# Patient Record
Sex: Male | Born: 1980
Health system: Southern US, Community
[De-identification: ages and names within clinical notes are randomized; demographics above are authoritative.]

## PROBLEM LIST (undated history)

## (undated) DIAGNOSIS — F988 Other specified behavioral and emotional disorders with onset usually occurring in childhood and adolescence: Secondary | ICD-10-CM

## (undated) DIAGNOSIS — J189 Pneumonia, unspecified organism: Secondary | ICD-10-CM

## (undated) DIAGNOSIS — I351 Nonrheumatic aortic (valve) insufficiency: Secondary | ICD-10-CM

## (undated) DIAGNOSIS — Z720 Tobacco use: Secondary | ICD-10-CM

## (undated) DIAGNOSIS — F419 Anxiety disorder, unspecified: Secondary | ICD-10-CM

## (undated) HISTORY — DX: Nonrheumatic aortic (valve) insufficiency: I35.1

## (undated) HISTORY — DX: Anxiety disorder, unspecified: F41.9

## (undated) HISTORY — PX: ADENOIDECTOMY: SUR15

## (undated) HISTORY — DX: Tobacco use: Z72.0

## (undated) HISTORY — PX: TONSILLECTOMY: SUR1361

## (undated) HISTORY — DX: Other specified behavioral and emotional disorders with onset usually occurring in childhood and adolescence: F98.8

---
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
## 2001-05-08 ENCOUNTER — Emergency Department (HOSPITAL_COMMUNITY): Admission: EM | Admit: 2001-05-08 | Discharge: 2001-05-08 | Payer: Self-pay | Admitting: Emergency Medicine

## 2001-05-08 ENCOUNTER — Encounter: Payer: Self-pay | Admitting: *Deleted

## 2001-06-24 ENCOUNTER — Emergency Department (HOSPITAL_COMMUNITY): Admission: EM | Admit: 2001-06-24 | Discharge: 2001-06-24 | Payer: Self-pay | Admitting: Emergency Medicine

## 2001-06-24 ENCOUNTER — Encounter: Payer: Self-pay | Admitting: Emergency Medicine

## 2005-08-04 ENCOUNTER — Emergency Department (HOSPITAL_COMMUNITY): Admission: EM | Admit: 2005-08-04 | Discharge: 2005-08-05 | Payer: Self-pay | Admitting: Emergency Medicine

## 2011-03-21 ENCOUNTER — Ambulatory Visit (HOSPITAL_BASED_OUTPATIENT_CLINIC_OR_DEPARTMENT_OTHER)
Admission: RE | Admit: 2011-03-21 | Discharge: 2011-03-21 | Disposition: A | Discharge status: Home / Self Care | Payer: BC Managed Care – PPO | Source: Ambulatory Visit | Attending: Family Medicine | Admitting: Family Medicine

## 2011-03-21 ENCOUNTER — Other Ambulatory Visit (HOSPITAL_BASED_OUTPATIENT_CLINIC_OR_DEPARTMENT_OTHER): Payer: Self-pay | Admitting: Family Medicine

## 2011-03-21 DIAGNOSIS — R109 Unspecified abdominal pain: Secondary | ICD-10-CM

## 2011-12-08 HISTORY — PX: SHOULDER SURGERY: SHX246

## 2013-07-03 ENCOUNTER — Other Ambulatory Visit: Payer: Self-pay | Admitting: Physician Assistant

## 2013-07-03 MED ORDER — MELOXICAM 15 MG PO TABS: 15.0000 mg | ORAL_TABLET | Freq: Every day | ORAL | Status: DC

## 2013-07-03 MED ORDER — MELOXICAM 15 MG PO TABS: 15.0000 mg | ORAL_TABLET | ORAL | Status: DC

## 2013-07-10 ENCOUNTER — Telehealth: Payer: Self-pay | Admitting: *Deleted

## 2013-07-10 NOTE — Telephone Encounter (Signed)
William Willis his chart's on your desk - you've seen him the last 4 x

## 2013-07-11 MED ORDER — AMPHETAMINE-DEXTROAMPHETAMINE 30 MG PO TABS: 30.0000 mg | ORAL_TABLET | Freq: Two times a day (BID) | ORAL | Status: DC

## 2013-07-23 ENCOUNTER — Encounter: Payer: Self-pay | Admitting: Internal Medicine

## 2013-07-24 ENCOUNTER — Encounter: Payer: Self-pay | Admitting: Internal Medicine

## 2013-07-24 DIAGNOSIS — F988 Other specified behavioral and emotional disorders with onset usually occurring in childhood and adolescence: Secondary | ICD-10-CM | POA: Insufficient documentation

## 2013-08-09 ENCOUNTER — Other Ambulatory Visit: Payer: Self-pay | Admitting: Emergency Medicine

## 2013-08-09 MED ORDER — CARBAMAZEPINE 200 MG PO TABS: 200.0000 mg | ORAL_TABLET | Freq: Three times a day (TID) | ORAL | Status: DC

## 2013-08-09 MED ORDER — AMPHETAMINE-DEXTROAMPHETAMINE 30 MG PO TABS: 30.0000 mg | ORAL_TABLET | Freq: Two times a day (BID) | ORAL | Status: DC

## 2013-09-12 ENCOUNTER — Other Ambulatory Visit: Payer: Self-pay | Admitting: Internal Medicine

## 2013-09-12 DIAGNOSIS — F988 Other specified behavioral and emotional disorders with onset usually occurring in childhood and adolescence: Secondary | ICD-10-CM

## 2013-09-12 MED ORDER — AMPHETAMINE-DEXTROAMPHETAMINE 30 MG PO TABS: 30.0000 mg | ORAL_TABLET | Freq: Two times a day (BID) | ORAL | Status: DC

## 2013-10-07 ENCOUNTER — Other Ambulatory Visit: Payer: Self-pay | Admitting: Physician Assistant

## 2013-10-07 DIAGNOSIS — F988 Other specified behavioral and emotional disorders with onset usually occurring in childhood and adolescence: Secondary | ICD-10-CM

## 2013-10-07 MED ORDER — AMPHETAMINE-DEXTROAMPHETAMINE 30 MG PO TABS: 30.0000 mg | ORAL_TABLET | Freq: Two times a day (BID) | ORAL | Status: DC

## 2013-10-09 ENCOUNTER — Telehealth: Payer: Self-pay | Admitting: *Deleted

## 2013-10-09 MED ORDER — BUSPIRONE HCL 10 MG PO TABS: 10.0000 mg | ORAL_TABLET | Freq: Three times a day (TID) | ORAL | Status: DC

## 2013-10-09 NOTE — Telephone Encounter (Signed)
Pt is calling says that buspar was only called in #3 & says his CPE is less than a mth away asking can we authorize enough till CPE

## 2013-10-09 NOTE — Telephone Encounter (Signed)
Spoke with Lawana ChambersAndrea Jacquot advised her per Quentin MullingAmanda Collier, PA will give her a 30 day supply, she has already picked up the 10, advised her will probably have to pay another co-pay she is ok with that, advised to keep appt for additional refills

## 2013-10-15 ENCOUNTER — Other Ambulatory Visit: Payer: Self-pay | Admitting: Emergency Medicine

## 2013-10-24 ENCOUNTER — Encounter: Payer: Self-pay | Admitting: Physician Assistant

## 2013-10-24 ENCOUNTER — Ambulatory Visit (INDEPENDENT_AMBULATORY_CARE_PROVIDER_SITE_OTHER): Payer: Self-pay | Admitting: Physician Assistant

## 2013-10-24 VITALS — BP 110/72 | HR 68 | Temp 98.6°F | Resp 16 | Ht 67.0 in | Wt 161.0 lb

## 2013-10-24 DIAGNOSIS — F988 Other specified behavioral and emotional disorders with onset usually occurring in childhood and adolescence: Secondary | ICD-10-CM

## 2013-10-24 MED ORDER — AMPHETAMINE-DEXTROAMPHETAMINE 30 MG PO TABS: 30.0000 mg | ORAL_TABLET | Freq: Two times a day (BID) | ORAL | Status: DC

## 2013-10-24 MED ORDER — PREDNISONE 20 MG PO TABS: ORAL_TABLET | ORAL | Status: DC

## 2013-10-24 MED ORDER — MELOXICAM 15 MG PO TABS: ORAL_TABLET | ORAL | Status: DC

## 2013-10-24 MED ORDER — BUSPIRONE HCL 10 MG PO TABS: 10.0000 mg | ORAL_TABLET | Freq: Three times a day (TID) | ORAL | Status: DC

## 2013-10-24 MED ORDER — CARBAMAZEPINE 200 MG PO TABS: ORAL_TABLET | ORAL | Status: DC

## 2013-10-24 NOTE — Progress Notes (Signed)
HPI 33 y.o. male with history of anxiety, ADD presents for follow up. He states he works with his hands and has intermittent hand pain for years, worse for last 2-3 months. Right handed, with numbness, pain, burning in right hand in medial nerve distribution, some decrease strength. Currently in the left hand as well.   He states that the 30mg  adderall is helping him but wears off at 5 and occasionally works longer. So far he has not asked for a prescription early and occasionally does not take it on the weekends.   Past Medical History  Diagnosis Date  . ADD (attention deficit disorder)   . Anxiety     No Known Allergies    Current Outpatient Prescriptions on File Prior to Visit  Medication Sig Dispense Refill  . amphetamine-dextroamphetamine (ADDERALL) 30 MG tablet Take 1 tablet (30 mg total) by mouth 2 (two) times daily.  60 tablet  0  . busPIRone (BUSPAR) 10 MG tablet Take 1 tablet (10 mg total) by mouth 3 (three) times daily.  90 tablet  0  . EPITOL 200 MG tablet TAKE ONE TABLET BY MOUTH THREE TIMES DAILY  90 tablet  0  . meloxicam (MOBIC) 15 MG tablet Take 1 tablet (15 mg total) by mouth See admin instructions. Take only one pill daily with food.  90 tablet  0   No current facility-administered medications on file prior to visit.    ROS: all negative expect above.   Physical: Filed Weights   10/24/13 1155  Weight: 161 lb (73.029 kg)   Filed Vitals:   10/24/13 1155  BP: 110/72  Pulse: 68  Temp: 98.6 F (37 C)  Resp: 16   General Appearance: Well nourished, in no apparent distress. Eyes: PERRLA, EOMs. Sinuses: No Frontal/maxillary tenderness ENT/Mouth: Ext aud canals clear, normal light reflex with TMs without erythema, bulging. Post pharynx without erythema, swelling, exudate.  Respiratory: CTAB Cardio: RRR, no murmurs, rubs or gallops. Peripheral pulses brisk and equal bilaterally, without edema. No aortic or femoral bruits. Abdomen: Soft, with bowl sounds.  Nontender, no guarding, rebound. Lymphatics: Non tender without lymphadenopathy.  Musculoskeletal: Full ROM all peripheral extremities, 5/5 strength, and normal gait. + tinels and phalen's right hand worse than left.  Skin: Warm, dry without rashes, lesions, ecchymosis.  Neuro: Cranial nerves intact, reflexes equal bilaterally. Normal muscle tone, no cerebellar symptoms. Sensation intact.  Pysch: Awake and oriented X 3, normal affect, Insight and Judgment appropriate.   Assessment and Plan: Anxiety- continue Buspar and tegretol ADD- Adderall 30 TID # 90 NR- explained to him and girlfriend, if he uses 90 in a month time that he will no longer get a prescription from this practice. They understand the potential addictive nature of the medication.  Bilateral hand pain- likely CTS- brace, prednisone, ice, stretches

## 2013-10-24 NOTE — Patient Instructions (Addendum)
Carpal Tunnel Syndrome Carpal tunnel syndrome is a disorder of the nervous system in the wrist that causes pain, hand weakness, and/or loss of feeling. Carpal tunnel syndrome is caused by the compression, stretching, or irritation of the median nerve at the wrist joint. Athletes who experience carpal tunnel syndrome may notice a decrease in their performance to the condition, especially for sports that require strong hand or wrist action.  SYMPTOMS   Tingling, numbness, or burning pain in the hand or fingers.  Inability to sleep due to pain in the hand.  Sharp pains that shoot from the wrist up the arm or to the fingers, especially at night.  Morning stiffness or cramping of the hand.  Thumb weakness, resulting in difficulty holding objects or making a fist.  Shiny, dry skin on the hand.  Reduced performance in any sport requiring a strong grip. CAUSES   Median nerve damage at the wrist is caused by pressure due to swelling, inflammation, or scarred tissue.  Sources of pressure include:  Repetitive gripping or squeezing that causes inflammation of the tendon sheaths.  Scarring or shortening of the ligament that covers the median nerve.  Traumatic injury to the wrist or forearm such as fracture, sprain, or dislocation.  Prolonged hyperextension (wrist bent backward) or hyperflexion (wrist bent downward) of the wrist. RISK INCREASES WITH:  Diabetes mellitus.  Menopause or amenorrhea.  Rheumatoid arthritis.  Raynaud's disease.  Pregnancy.  Gout.  Kidney disease.  Ganglion cyst.  Repetitive hand or wrist action.  Hypothyroidism (underactive thyroid gland).  Repetitive jolting or shaking of the hands or wrist.  Prolonged forceful weight-bearing on the hands. PREVENTION  Bracing the hand and wrist straight during activities that involve repetitive grasping.  For activities that require prolonged extension of the wrist (bending towards the top of the forearm)  periodically change the position of your wrists.  Learn and use proper technique in activities that result in the wrist position in neutral to slight extension.  Avoid bending the wrist into full extension or flexion (up or down)  Keep the wrist in a straight (neutral) position. To keep the wrist in this position, wear a splint.  Avoid repetitive hand and wrist motions.  When possible avoid prolonged grasping of items (steering wheel of a car, a pen, a vacuum cleaner, or a rake).  Loosen your grip for activities that require prolonged grasping of items.  Place keyboards and writing surfaces at the correct height as to decrease strain on the wrist and hand.  Alternate work tasks to avoid prolonged wrist flexion.  Avoid pinching activities (needlework and writing) as they may irritate your carpal tunnel syndrome.  If these activities are necessary, complete them for shorter periods of time.  When writing, use a felt tip or roller ball pen and/or build up the grip on a pen to decrease the forces required for writing. PROGNOSIS  Carpal tunnel syndrome is usually curable with appropriate conservative treatment and sometimes resolves spontaneously. For some cases, surgery is necessary, especially if muscle wasting or nerve changes have developed.  RELATED COMPLICATIONS   Permanent numbness and a weak thumb or fingers in the affected hand.  Permanent paralysis of a portion of the hand and fingers. TREATMENT  Treatment initially consists of stopping activities that aggravate the symptoms as well as medication and ice to reduce inflammation. A wrist splint is often recommended for wear during activities of repetitive motion as well as at night. It is also important to learn and use proper technique   when performing activities that typically cause pain. On occasion, a corticosteroid injection may be given. If symptoms persist despite conservative treatment, surgery may be an option. Surgical  techniques free the pinched or compressed nerve. Carpal tunnel surgery is usually performed on an outpatient basis, meaning you go home the same day as surgery. These procedures provide almost complete relief of all symptoms in 95% of patients. Expect at least 2 weeks for healing after surgery. For cases that are the result of repeated jolting or shaking of the hand or wrist or prolonged hyperextension, surgery is not usually recommended, because stretching of the median nerve and not compression are usually the cause of carpal tunnel syndrome in these cases. MEDICATION   If pain medication is necessary, nonsteroidal anti-inflammatory medications, such as aspirin and ibuprofen, or other minor pain relievers, such as acetaminophen, are often recommended.  Do not take pain medication for 7 days before surgery.  Prescription pain relievers are usually only prescribed after surgery. Use only as directed and only as much as you need.  Corticosteroid injections may be given to reduce inflammation. However, they are not always recommended.  Vitamin B6 (pyridoxine) may reduce symptoms; use only if prescribed for your disorder. SEEK MEDICAL CARE IF:   Symptoms get worse or do not improve in 2 weeks despite treatment.  You also have a current or recent history of neck or shoulder injury that has resulted in pain or tingling elsewhere in your arm. Document Released: 08/08/2005 Document Revised: 12/03/2012 Document Reviewed: 11/20/2008 [facility] Patient Information 2014 [place], [place].   Wear brace at night for 3 weeks

## 2013-10-29 ENCOUNTER — Ambulatory Visit (INDEPENDENT_AMBULATORY_CARE_PROVIDER_SITE_OTHER): Payer: Self-pay | Admitting: Internal Medicine

## 2013-10-29 ENCOUNTER — Encounter: Payer: Self-pay | Admitting: Internal Medicine

## 2013-10-29 VITALS — BP 118/74 | HR 76 | Temp 99.1°F | Resp 18 | Ht 67.0 in | Wt 157.0 lb

## 2013-10-29 DIAGNOSIS — F411 Generalized anxiety disorder: Secondary | ICD-10-CM

## 2013-10-29 DIAGNOSIS — F909 Attention-deficit hyperactivity disorder, unspecified type: Secondary | ICD-10-CM

## 2013-10-29 NOTE — Progress Notes (Signed)
Patient ID: William Willis, male   DOB: [birth year], 33 y.o.   MRN: [id number]   Annual  Examination  This very nice 33 y.o.  MWM presents for evaluation.  Patient has been followed for ADDand anxiety Today's BP: 118/74 mmHg. Patient denies any cardiac symptoms as chest pain, palpitations, shortness of breath, dizziness or ankle swelling.   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 He had been out of work for an extended period while recovering from 2 surgeries on his Rt shoulder.   Finally, patient has history of Vitamin D Deficiency with last vitamin D of 37 in Dec 2013.    Medication List    amphetamine-dextroamphetamine 30 MG tablet  Commonly known as:  ADDERALL  Take 1 tablet (30 mg total) by mouth 2 (two) times daily.     busPIRone 10 MG tablet  Commonly known as:  BUSPAR  Take 1 tablet (10 mg total) by mouth 3 (three) times daily.     carbamazepine 200 MG tablet  Commonly known as:  Tegretol  TAKE ONE TABLET BY MOUTH THREE TIMES DAILY     cholecalciferol 1000 UNITS tablet  Commonly known as:  VITAMIN D  Take 1,000 Units by mouth daily.     cyanocobalamin 500 MCG tablet  Take 500 mcg by mouth daily.     meloxicam 15 MG tablet  Commonly known as:  MOBIC  Take only one pill daily with food.        No Known Allergies  Past Medical History  Diagnosis Date  . ADD (attention deficit disorder)   . Anxiety     Past Surgical History  Procedure Laterality Date  . Shoulder surgery Right 12-08-11    No family history on file.  History   Social History  . Marital Status: Married    Spouse Name: N/A    Number of Children: N/A  . Years of Education: N/A   Occupational History  . Sheet metal Fabricator   Social History Main Topics  . Smoking status: Current Every Day Smoker -- 2.00 packs/day  . Smokeless tobacco: Not on file  . Alcohol Use: Yes     Comment: rare  .  Drug Use: No  . Sexual Activity: Not on file     ROS Constitutional: Denies fever, chills, weight loss/gain, headaches, insomnia, fatigue, night sweats, and change in appetite. Eyes: Denies redness, blurred vision, diplopia, discharge, itchy, watery eyes.  ENT: Denies discharge, congestion, post nasal drip, epistaxis, sore throat, earache, hearing loss, dental pain, Tinnitus, Vertigo, Sinus pain, snoring.  Cardio: Denies chest pain, palpitations, irregular heartbeat, syncope, dyspnea, diaphoresis, orthopnea, PND, claudication, edema Respiratory: denies cough, dyspnea, DOE, pleurisy, hoarseness, laryngitis, wheezing.  Gastrointestinal: Denies dysphagia, heartburn, reflux, water brash, pain, cramps, nausea, vomiting, bloating, diarrhea, constipation, hematemesis, melena, hematochezia, jaundice, hemorrhoids Genitourinary: Denies dysuria, frequency, urgency, nocturia, hesitancy, discharge, hematuria, flank pain Musculoskeletal: Denies arthralgia, myalgia, stiffness, Jt. Swelling, pain, limp, and strain/sprain. Skin: Denies puritis, rash, hives, warts, acne, eczema, changing in skin lesion Neuro: No weakness, tremor, incoordination, spasms, paresthesia, pain Psychiatric: Denies confusion, memory loss, sensory loss Endocrine: Denies change in weight, skin, hair change, nocturia, and paresthesia, diabetic polys, visual blurring, hyper / hypo glycemic episodes.  Heme/Lymph: No excessive bleeding, bruising, or elarged lymph nodes.  BP: 118/74  Pulse: 76  Temp: 99.1 F (37.3 C)  Resp: 18    Estimated body mass index  is 24.58 kg/(m^2) as calculated from the following:   Height as of this encounter: 5\' 7"  (1.702 m).   Weight as of this encounter: 157 lb (71.215 kg).  Physical Exam General Appearance: Well nourished, in no apparent distress. Eyes: PERRLA, EOMs, conjunctiva no swelling or erythema, normal fundi and vessels. Sinuses: No frontal/maxillary tenderness ENT/Mouth: EACs patent / TMs   nl. Nares clear without erythema, swelling, mucoid exudates. Oral hygiene is good. No erythema, swelling, or exudate. Tongue normal, non-obstructing. Tonsils not swollen or erythematous. Hearing normal.  Neck: Supple, thyroid normal. No bruits, nodes or JVD. Respiratory: Respiratory effort normal.  BS equal and clear bilateral without rales, rhonci, wheezing or stridor. Cardio: Heart sounds are normal with regular rate and rhythm and no murmurs, rubs or gallops. Peripheral pulses are normal and equal bilaterally without edema. No aortic or femoral bruits. Chest: symmetric with normal excursions and percussion.  Abdomen: Flat, soft, with bowl sounds. Nontender, no guarding, rebound, hernias, masses, or organomegaly.  Lymphatics: Non tender without lymphadenopathy.  Genitourinary: No hernias.Testes nl. DRE - prostate nl for age - smooth & firm w/o nodules. Musculoskeletal: Full ROM all peripheral extremities, joint stability, 5/5 strength, and normal gait. Skin: Warm and dry without rashes, lesions, cyanosis, clubbing or  ecchymosis.  Neuro: Cranial nerves intact, reflexes equal bilaterally. Normal muscle tone, no cerebellar symptoms. Sensation intact.  Pysch: Awake and oriented X 3, normal affect, insight and judgment appropriate.   Assessment and Plan  1. ADD 2. Anxiety 3. Vitamin D Deficiency  Continue prudent diet as discussed, regular exercise, and medications as discussed.  Discussed med effects and SE's. Regular follow-up recommended in 3-6 months. As he is stable and doing well will defer lab studies til he is insured.

## 2013-10-29 NOTE — Patient Instructions (Addendum)
Attention Deficit Hyperactivity Disorder Attention deficit hyperactivity disorder (ADHD) is a problem with behavior issues based on the way the brain functions (neurobehavioral disorder). It is a common reason for behavior and academic problems in school. SYMPTOMS  There are 3 types of ADHD. The 3 types and some of the symptoms include:  Inattentive  Gets bored or distracted easily.  Loses or forgets things. Forgets to hand in homework.  Has trouble organizing or completing tasks.  Difficulty staying on task.  An inability to organize daily tasks and school work.  Leaving projects, chores, or homework unfinished.  Trouble paying attention or responding to details. Careless mistakes.  Difficulty following directions. Often seems like is not listening.  Dislikes activities that require sustained attention (like chores or homework).  Hyperactive-impulsive  Feels like it is impossible to sit still or stay in a seat. Fidgeting with hands and feet.  Trouble waiting turn.  Talking too much or out of turn. Interruptive.  Speaks or acts impulsively.  Aggressive, disruptive behavior.  Constantly busy or on the go, noisy.  Often leaves seat when they are expected to remain seated.  Often runs or climbs where it is not appropriate, or feels very restless.  Combined  Has symptoms of both of the above. Often children with ADHD feel discouraged about themselves and with school. They often perform well below their abilities in school. As children get older, the excess motor activities can calm down, but the problems with paying attention and staying organized persist. Most children do not outgrow ADHD but with good treatment can learn to cope with the symptoms. DIAGNOSIS  When ADHD is suspected, the diagnosis should be made by professionals trained in ADHD. This professional will collect information about the individual suspected of having ADHD. Information must be collected from  various settings where the person lives, works, or attends school.  Diagnosis will include:  Confirming symptoms began in childhood.  Ruling out other reasons for the child's behavior.  The health care providers will check with the child's school and check their medical records.  They will talk to teachers and parents.  Behavior rating scales for the child will be filled out by those dealing with the child on a daily basis. A diagnosis is made only after all information has been considered. TREATMENT  Treatment usually includes behavioral treatment, tutoring or extra support in school, and stimulant medicines. Because of the way a person's brain works with ADHD, these medicines decrease impulsivity and hyperactivity and increase attention. This is different than how they would work in a person who does not have ADHD. Other medicines used include antidepressants and certain blood pressure medicines. Most experts agree that treatment for ADHD should address all aspects of the person's functioning. Along with medicines, treatment should include structured classroom management at school. Parents should reward good behavior, provide constant discipline, and limit-setting. Tutoring should be available for the child as needed. ADHD is a life-long condition. If untreated, the disorder can have long-term serious effects into adolescence and adulthood. HOME CARE INSTRUCTIONS   Often with ADHD there is a lot of frustration among family members dealing with the condition. Blame and anger are also feelings that are common. In many cases, because the problem affects the family as a whole, the entire family may need help. A therapist can help the family find better ways to handle the disruptive behaviors of the person with ADHD and promote change. If the person with ADHD is young, most of the therapist's work   is with the parents. Parents will learn techniques for coping with and improving their child's  behavior. Sometimes only the child with the ADHD needs counseling. Your health care providers can help you make these decisions.  Children with ADHD may need help learning how to organize. Some helpful tips include:  Keep routines the same every day from wake-up time to bedtime. Schedule all activities, including homework and playtime. Keep the schedule in a place where the person with ADHD will often see it. Mark schedule changes as far in advance as possible.  Schedule outdoor and indoor recreation.  Have a place for everything and keep everything in its place. This includes clothing, backpacks, and school supplies.  Encourage writing down assignments and bringing home needed books. Work with your child's teachers for assistance in organizing school work.  Offer your child a well-balanced diet. Breakfast that includes a balance of whole grains, protein and, fruits or vegetables is especially important for school performance. Children should avoid drinks with caffeine including:  Soft drinks.  Coffee.  Tea.  However, some older children (adolescents) may find these drinks helpful in improving their attention. Because it can also be common for adolescents with ADHD to become addicted to caffeine, talk with your health care provider about what is a safe amount of caffeine intake for your child.  Children with ADHD need consistent rules that they can understand and follow. If rules are followed, give small rewards. Children with ADHD often receive, and expect, criticism. Look for good behavior and praise it. Set realistic goals. Give clear instructions. Look for activities that can foster success and self-esteem. Make time for pleasant activities with your child. Give lots of affection.  Parents are their children's greatest advocates. Learn as much as possible about ADHD. This helps you become a stronger and better advocate for your child. It also helps you educate your child's teachers and  instructors if they feel inadequate in these areas. Parent support groups are often helpful. A national group with local chapters is called Children and Adults with Attention Deficit Hyperactivity Disorder (CHADD). SEEK MEDICAL CARE IF:  Your child has repeated muscle twitches, cough or speech outbursts.  Your child has sleep problems.  Your child has a marked loss of appetite.  Your child develops depression.  Your child has new or worsening behavioral problems.  Your child develops dizziness.  Your child has a racing heart.  Your child has stomach pains.  Your child develops headaches. SEEK IMMEDIATE MEDICAL CARE IF:  Your child has been diagnosed with depression or anxiety and the symptoms seem to be getting worse.  Your child has been depressed and suddenly appears to have increased energy or motivation.  You are worried that your child is having a bad reaction to a medication he or she is taking for ADHD. Document Released: 07/29/2002 Document Revised: 05/29/2013 Document Reviewed: 04/15/2013 [facility] Patient Information 2014 [place], [place].   Generalized Anxiety Disorder Generalized anxiety disorder (GAD) is a mental disorder. It interferes with life functions, including relationships, work, and school. GAD is different from normal anxiety, which everyone experiences at some point in their lives in response to specific life events and activities. Normal anxiety actually helps us prepare for and get through these life events and activities. Normal anxiety goes away after the event or activity is over.  GAD causes anxiety that is not necessarily related to specific events or activities. It also causes excess anxiety in proportion to specific events or activities. The anxiety associated  with GAD is also difficult to control. GAD can vary from mild to severe. People with severe GAD can have intense waves of anxiety with physical symptoms (panic attacks).  SYMPTOMS The anxiety  and worry associated with GAD are difficult to control. This anxiety and worry are related to many life events and activities and also occur more days than not for 6 months or longer. People with GAD also have three or more of the following symptoms (one or more in children):  Restlessness.   Fatigue.  Difficulty concentrating.   Irritability.  Muscle tension.  Difficulty sleeping or unsatisfying sleep. DIAGNOSIS GAD is diagnosed through an assessment by your caregiver. Your caregiver will ask you questions aboutyour mood,physical symptoms, and events in your life. Your caregiver may ask you about your medical history and use of alcohol or drugs, including prescription medications. Your caregiver may also do a physical exam and blood tests. Certain medical conditions and the use of certain substances can cause symptoms similar to those associated with GAD. Your caregiver may refer you to a mental health specialist for further evaluation. TREATMENT The following therapies are usually used to treat GAD:   Medication Antidepressant medication usually is prescribed for long-term daily control. Antianxiety medications may be added in severe cases, especially when panic attacks occur.   Talk therapy (psychotherapy) Certain types of talk therapy can be helpful in treating GAD by providing support, education, and guidance. A form of talk therapy called cognitive behavioral therapy can teach you healthy ways to think about and react to daily life events and activities.  Stress managementtechniques These include yoga, meditation, and exercise and can be very helpful when they are practiced regularly. A mental health specialist can help determine which treatment is best for you. Some people see improvement with one therapy. However, other people require a combination of therapies. Document Released: 12/03/2012 Document Reviewed: 12/03/2012 [facility] Patient Information 2014 [place], [place].

## 2013-12-06 ENCOUNTER — Other Ambulatory Visit: Payer: Self-pay | Admitting: Physician Assistant

## 2013-12-10 ENCOUNTER — Other Ambulatory Visit: Payer: Self-pay | Admitting: Physician Assistant

## 2013-12-10 DIAGNOSIS — F988 Other specified behavioral and emotional disorders with onset usually occurring in childhood and adolescence: Secondary | ICD-10-CM

## 2013-12-10 MED ORDER — AMPHETAMINE-DEXTROAMPHETAMINE 30 MG PO TABS: 30.0000 mg | ORAL_TABLET | Freq: Two times a day (BID) | ORAL | Status: DC

## 2014-01-10 ENCOUNTER — Other Ambulatory Visit: Payer: Self-pay | Admitting: Internal Medicine

## 2014-01-10 DIAGNOSIS — F988 Other specified behavioral and emotional disorders with onset usually occurring in childhood and adolescence: Secondary | ICD-10-CM

## 2014-01-10 MED ORDER — AMPHETAMINE-DEXTROAMPHETAMINE 30 MG PO TABS: 30.0000 mg | ORAL_TABLET | Freq: Two times a day (BID) | ORAL | Status: DC

## 2014-01-20 ENCOUNTER — Other Ambulatory Visit: Payer: Self-pay | Admitting: Internal Medicine

## 2014-01-20 DIAGNOSIS — F988 Other specified behavioral and emotional disorders with onset usually occurring in childhood and adolescence: Secondary | ICD-10-CM

## 2014-01-20 MED ORDER — AMPHETAMINE-DEXTROAMPHETAMINE 30 MG PO TABS: ORAL_TABLET | ORAL | Status: DC

## 2014-01-21 ENCOUNTER — Other Ambulatory Visit: Payer: Self-pay | Admitting: Physician Assistant

## 2014-01-21 DIAGNOSIS — F988 Other specified behavioral and emotional disorders with onset usually occurring in childhood and adolescence: Secondary | ICD-10-CM

## 2014-01-21 MED ORDER — AMPHETAMINE-DEXTROAMPHETAMINE 30 MG PO TABS: ORAL_TABLET | ORAL | Status: DC

## 2014-01-21 NOTE — Progress Notes (Signed)
Will do TID, but will monitor very closely. Patient should be getting insurance in 1-2 months and will be able to try Wellbutrin at that time. Works multiple jobs and take 2 during the day and occ 1/2 at night.

## 2014-01-23 ENCOUNTER — Other Ambulatory Visit: Payer: Self-pay | Admitting: Internal Medicine

## 2014-02-11 ENCOUNTER — Other Ambulatory Visit: Payer: Self-pay | Admitting: Physician Assistant

## 2014-03-05 ENCOUNTER — Other Ambulatory Visit: Payer: Self-pay | Admitting: Physician Assistant

## 2014-03-05 DIAGNOSIS — F988 Other specified behavioral and emotional disorders with onset usually occurring in childhood and adolescence: Secondary | ICD-10-CM

## 2014-03-05 MED ORDER — AMPHETAMINE-DEXTROAMPHETAMINE 30 MG PO TABS: ORAL_TABLET | ORAL | Status: DC

## 2014-03-23 ENCOUNTER — Other Ambulatory Visit: Payer: Self-pay | Admitting: Physician Assistant

## 2014-04-21 ENCOUNTER — Other Ambulatory Visit: Payer: Self-pay | Admitting: Internal Medicine

## 2014-04-21 DIAGNOSIS — F988 Other specified behavioral and emotional disorders with onset usually occurring in childhood and adolescence: Secondary | ICD-10-CM

## 2014-04-21 MED ORDER — AMPHETAMINE-DEXTROAMPHETAMINE 30 MG PO TABS: ORAL_TABLET | ORAL | Status: DC

## 2014-05-06 ENCOUNTER — Ambulatory Visit: Payer: Self-pay | Admitting: Internal Medicine

## 2014-05-06 ENCOUNTER — Encounter: Payer: Self-pay | Admitting: Internal Medicine

## 2014-05-06 NOTE — Progress Notes (Signed)
Patient ID: William Willis, male   DOB: 09/19/1980, 33 y.o.   MRN: [id number]   R E S C H E D U L E D

## 2014-05-07 ENCOUNTER — Ambulatory Visit (INDEPENDENT_AMBULATORY_CARE_PROVIDER_SITE_OTHER): Payer: Self-pay | Admitting: Internal Medicine

## 2014-05-07 ENCOUNTER — Ambulatory Visit: Payer: Self-pay | Admitting: Internal Medicine

## 2014-05-07 ENCOUNTER — Encounter: Payer: Self-pay | Admitting: Internal Medicine

## 2014-05-07 VITALS — BP 116/80 | HR 84 | Temp 98.1°F | Resp 16 | Ht 67.0 in | Wt 159.2 lb

## 2014-05-07 DIAGNOSIS — F988 Other specified behavioral and emotional disorders with onset usually occurring in childhood and adolescence: Secondary | ICD-10-CM

## 2014-05-07 DIAGNOSIS — E559 Vitamin D deficiency, unspecified: Secondary | ICD-10-CM | POA: Insufficient documentation

## 2014-05-07 MED ORDER — AMPHETAMINE-DEXTROAMPHETAMINE 30 MG PO TABS: ORAL_TABLET | ORAL | Status: DC

## 2014-05-07 NOTE — Patient Instructions (Signed)
Generalized Anxiety Disorder Generalized anxiety disorder (GAD) is a mental disorder. It interferes with life functions, including relationships, work, and school. GAD is different from normal anxiety, which everyone experiences at some point in their lives in response to specific life events and activities. Normal anxiety actually helps us prepare for and get through these life events and activities. Normal anxiety goes away after the event or activity is over.  GAD causes anxiety that is not necessarily related to specific events or activities. It also causes excess anxiety in proportion to specific events or activities. The anxiety associated with GAD is also difficult to control. GAD can vary from mild to severe. People with severe GAD can have intense waves of anxiety with physical symptoms (panic attacks).  SYMPTOMS The anxiety and worry associated with GAD are difficult to control. This anxiety and worry are related to many life events and activities and also occur more days than not for 6 months or longer. People with GAD also have three or more of the following symptoms (one or more in children):  Restlessness.   Fatigue.  Difficulty concentrating.   Irritability.  Muscle tension.  Difficulty sleeping or unsatisfying sleep. DIAGNOSIS GAD is diagnosed through an assessment by your health care provider. Your health care provider will ask you questions aboutyour mood,physical symptoms, and events in your life. Your health care provider may ask you about your medical history and use of alcohol or drugs, including prescription medicines. Your health care provider may also do a physical exam and blood tests. Certain medical conditions and the use of certain substances can cause symptoms similar to those associated with GAD. Your health care provider may refer you to a mental health specialist for further evaluation. TREATMENT The following therapies are usually used to treat GAD:    Medication. Antidepressant medication usually is prescribed for long-term daily control. Antianxiety medicines may be added in severe cases, especially when panic attacks occur.   Talk therapy (psychotherapy). Certain types of talk therapy can be helpful in treating GAD by providing support, education, and guidance. A form of talk therapy called cognitive behavioral therapy can teach you healthy ways to think about and react to daily life events and activities.  Stress managementtechniques. These include yoga, meditation, and exercise and can be very helpful when they are practiced regularly. A mental health specialist can help determine which treatment is best for you. Some people see improvement with one therapy. However, other people require a combination of therapies. Document Released: 12/03/2012 Document Revised: 12/23/2013 Document Reviewed: 12/03/2012 ExitCare Patient Information 2015 [employer]. This information is not intended to replace advice given to you by your health care provider. Make sure you discuss any questions you have with your health care provider. Bipolar Disorder Bipolar disorder is a mental illness. The term bipolar disorder actually is used to describe a group of disorders that all share varying degrees of emotional highs and lows that can interfere with daily functioning, such as work, school, or relationships. Bipolar disorder also can lead to drug abuse, hospitalization, and suicide. The emotional highs of bipolar disorder are periods of elation or irritability and high energy. These highs can range from a mild form (hypomania) to a severe form (mania). People experiencing episodes of hypomania may appear energetic, excitable, and highly productive. People experiencing mania may behave impulsively or erratically. They often make poor decisions. They may have difficulty sleeping. The most severe episodes of mania can involve having very distorted beliefs or  perceptions about the   world and seeing or hearing things that are not real (psychotic delusions and hallucinations).  The emotional lows of bipolar disorder (depression) also can range from mild to severe. Severe episodes of bipolar depression can involve psychotic delusions and hallucinations. Sometimes people with bipolar disorder experience a state of mixed mood. Symptoms of hypomania or mania and depression are both present during this mixed-mood episode. SIGNS AND SYMPTOMS There are signs and symptoms of the episodes of hypomania and mania as well as the episodes of depression. The signs and symptoms of hypomania and mania are similar but vary in severity. They include:  Inflated self-esteem or feeling of increased self-confidence.  Decreased need for sleep.  Unusual talkativeness (rapid or pressured speech) or the feeling of a need to keep talking.  Sensation of racing thoughts or constant talking, with quick shifts between topics that may or may not be related (flight of ideas).  Decreased ability to focus or concentrate.  Increased purposeful activity, such as work, studies, or social activity, or nonproductive activity, such as pacing, squirming and fidgeting, or finger and toe tapping.  Impulsive behavior and use of poor judgment, resulting in high-risk activities, such as having unprotected sex or spending excessive amounts of money. Signs and symptoms of depression include the following:   Feelings of sadness, hopelessness, or helplessness.  Frequent or uncontrollable episodes of crying.  Lack of feeling anything or caring about anything.  Difficulty sleeping or sleeping too much.  Inability to enjoy the things you used to enjoy.   Desire to be alone all the time.   Feelings of guilt or worthlessness.  Lack of energy or motivation.   Difficulty concentrating, remembering, or making decisions.  Change in appetite or weight beyond normal  fluctuations.  Thoughts of death or the desire to harm yourself. DIAGNOSIS  Bipolar disorder is diagnosed through an assessment by your caregiver. Your caregiver will ask questions about your emotional episodes. There are two main types of bipolar disorder. People with type I bipolar disorder have manic episodes with or without depressive episodes. People with type II bipolar disorder have hypomanic episodes and major depressive episodes, which are more serious than mild depression. The type of bipolar disorder you have can make an important difference in how your illness is monitored and treated. Your caregiver may ask questions about your medical history and use of alcohol or drugs, including prescription medication. Certain medical conditions and substances also can cause emotional highs and lows that resemble bipolar disorder (secondary bipolar disorder).  TREATMENT  Bipolar disorder is a long-term illness. It is best controlled with continuous treatment rather than treatment only when symptoms occur. The following treatments can be prescribed for bipolar disorders:  Medication--Medication can be prescribed by a doctor that is an expert in treating mental disorders (psychiatrists). Medications called mood stabilizers are usually prescribed to help control the illness. Other medications are sometimes added if symptoms of mania, depression, or psychotic delusions and hallucinations occur despite the use of a mood stabilizer.  Talk therapy--Some forms of talk therapy are helpful in providing support, education, and guidance. A combination of medication and talk therapy is best for managing the disorder over time. A procedure in which electricity is applied to your brain through your scalp (electroconvulsive therapy) is used in cases of severe mania when medication and talk therapy do not work or work too slowly. Document Released: 11/14/2000 Document Revised: 12/03/2012 Document Reviewed:  09/03/2012 Exit[employer] Patient Information 2015 [employer]. This information is not intended to replace   advice given to you by your health care provider. Make sure you discuss any questions you have with your health care provider.  

## 2014-05-07 NOTE — Progress Notes (Signed)
   Subjective:    Patient ID: William Willis, male    DOB: Jul 13, 1981, 33 y.o.   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 Hiss ADD predates many years since initial Dx at age 60 and he still reports benefit of meds in helping him focus and concentrate in his daily work activities.   Medication List   amphetamine-dextroamphetamine 30 MG tablet  Commonly known as:  ADDERALL  1 tablet BID for ADD     busPIRone 10 MG tablet  Commonly known as:  BUSPAR  TAKE 1 TABLET BY MOUTH THREE TIMES DAILY     carbamazepine 200 MG tablet  Commonly known as:  TEGRETOL  TAKE 1 TABLET BY MOUTH THREE TIMES DAILY     cholecalciferol 1000 UNITS tablet  Commonly known as:  VITAMIN D  Take 1,000 Units by mouth daily.     cyanocobalamin 500 MCG tablet  Take 500 mcg by mouth daily.     meloxicam 15 MG tablet  Commonly known as:  MOBIC  Take only one pill daily with food.     No Known Allergies  Past Medical History  Diagnosis Date  . ADD (attention deficit disorder)   . Anxiety    Review of Systems 12 point systems review is negative.  Objective:   Physical Exam  BP 116/80  Pulse 84  Temp(Src) 98.1 F (36.7 C) (Temporal)  Resp 16  Ht  (1.702 m)  Wt 159 lb 3.2 oz (72.213 kg)  BMI 24.93 kg/m2  HEENT - Eac's patent. TM's Nl. EOM's full. PERRLA. NasoOroPharynx clear. Neck - supple. Nl Thyroid. Carotids 2+ & No bruits, nodes, JVD Chest - Clear equal BS w/o Rales, rhonchi, wheezes. Cor - Nl HS. RRR w/o sig MGR. PP 1(+). No edema. Abd - No palpable organomegaly, masses or tenderness. BS nl. MS- FROM w/o deformities. Muscle power, tone and bulk Nl. Gait Nl. Neuro - No obvious Cr N abnormalities. Sensory, motor and Cerebellar functions appear Nl w/o focal abnormalities. Psyche -  Mental status normal & appropriate.  No delusions, ideations or obvious mood abnormalities.  Assessment & Plan:   1. ADD (attention deficit disorder)  - amphetamine-dextroamphetamine (ADDERALL) 30 MG tablet; 1 tablet BID for ADD  Dispense: 90 tablet; Refill: 0 (predated for next refill on Sept 30) 2. Vitamin D Deficiency

## 2014-05-13 ENCOUNTER — Other Ambulatory Visit: Payer: Self-pay | Admitting: Internal Medicine

## 2014-07-08 ENCOUNTER — Other Ambulatory Visit: Payer: Self-pay | Admitting: Internal Medicine

## 2014-07-08 ENCOUNTER — Other Ambulatory Visit: Payer: Self-pay

## 2014-07-08 DIAGNOSIS — F988 Other specified behavioral and emotional disorders with onset usually occurring in childhood and adolescence: Secondary | ICD-10-CM

## 2014-07-08 MED ORDER — AMPHETAMINE-DEXTROAMPHETAMINE 30 MG PO TABS: ORAL_TABLET | ORAL | Status: DC

## 2014-07-08 MED ORDER — BUSPIRONE HCL 10 MG PO TABS: 10.0000 mg | ORAL_TABLET | Freq: Three times a day (TID) | ORAL | Status: DC

## 2014-07-14 ENCOUNTER — Other Ambulatory Visit: Payer: Self-pay | Admitting: Physician Assistant

## 2014-07-31 ENCOUNTER — Encounter: Payer: Self-pay | Admitting: Internal Medicine

## 2014-08-20 ENCOUNTER — Other Ambulatory Visit: Payer: Self-pay | Admitting: Physician Assistant

## 2014-08-20 ENCOUNTER — Other Ambulatory Visit: Payer: Self-pay

## 2014-08-20 DIAGNOSIS — F988 Other specified behavioral and emotional disorders with onset usually occurring in childhood and adolescence: Secondary | ICD-10-CM

## 2014-08-20 MED ORDER — AMPHETAMINE-DEXTROAMPHETAMINE 30 MG PO TABS: ORAL_TABLET | ORAL | Status: DC

## 2014-08-20 NOTE — Telephone Encounter (Signed)
Per patient request changed Rx from 90 lasting 45 days to 60 for 30 days to avoid confusion

## 2014-09-22 ENCOUNTER — Other Ambulatory Visit: Payer: Self-pay | Admitting: Internal Medicine

## 2014-09-22 DIAGNOSIS — F988 Other specified behavioral and emotional disorders with onset usually occurring in childhood and adolescence: Secondary | ICD-10-CM

## 2014-09-22 MED ORDER — AMPHETAMINE-DEXTROAMPHETAMINE 30 MG PO TABS: ORAL_TABLET | ORAL | Status: DC

## 2014-09-23 ENCOUNTER — Other Ambulatory Visit: Payer: Self-pay | Admitting: *Deleted

## 2014-09-23 MED ORDER — MELOXICAM 15 MG PO TABS: ORAL_TABLET | ORAL | Status: DC

## 2014-10-21 ENCOUNTER — Other Ambulatory Visit: Payer: Self-pay | Admitting: Internal Medicine

## 2014-10-21 DIAGNOSIS — F988 Other specified behavioral and emotional disorders with onset usually occurring in childhood and adolescence: Secondary | ICD-10-CM

## 2014-10-21 MED ORDER — AMPHETAMINE-DEXTROAMPHETAMINE 30 MG PO TABS: ORAL_TABLET | ORAL | Status: DC

## 2014-10-30 ENCOUNTER — Encounter: Payer: Self-pay | Admitting: Internal Medicine

## 2014-11-24 ENCOUNTER — Other Ambulatory Visit: Payer: Self-pay | Admitting: Internal Medicine

## 2014-11-24 DIAGNOSIS — F988 Other specified behavioral and emotional disorders with onset usually occurring in childhood and adolescence: Secondary | ICD-10-CM

## 2014-11-24 MED ORDER — AMPHETAMINE-DEXTROAMPHETAMINE 30 MG PO TABS: ORAL_TABLET | ORAL | Status: DC

## 2014-12-04 ENCOUNTER — Encounter: Payer: Self-pay | Admitting: Internal Medicine

## 2014-12-04 ENCOUNTER — Other Ambulatory Visit: Payer: Self-pay | Admitting: *Deleted

## 2014-12-04 ENCOUNTER — Ambulatory Visit: Payer: Self-pay | Admitting: Internal Medicine

## 2014-12-04 VITALS — BP 132/76 | HR 72 | Temp 98.2°F | Resp 16 | Ht 67.5 in | Wt 153.2 lb

## 2014-12-04 DIAGNOSIS — F988 Other specified behavioral and emotional disorders with onset usually occurring in childhood and adolescence: Secondary | ICD-10-CM

## 2014-12-04 DIAGNOSIS — F411 Generalized anxiety disorder: Secondary | ICD-10-CM

## 2014-12-04 DIAGNOSIS — E559 Vitamin D deficiency, unspecified: Secondary | ICD-10-CM

## 2014-12-04 DIAGNOSIS — Z0000 Encounter for general adult medical examination without abnormal findings: Secondary | ICD-10-CM

## 2014-12-04 MED ORDER — CARBAMAZEPINE 200 MG PO TABS: 200.0000 mg | ORAL_TABLET | Freq: Three times a day (TID) | ORAL | Status: DC

## 2014-12-04 MED ORDER — BUSPIRONE HCL 10 MG PO TABS: 10.0000 mg | ORAL_TABLET | Freq: Three times a day (TID) | ORAL | Status: DC

## 2014-12-04 MED ORDER — BUPROPION HCL ER (XL) 300 MG PO TB24: 300.0000 mg | ORAL_TABLET | ORAL | Status: DC

## 2014-12-04 MED ORDER — MELOXICAM 15 MG PO TABS: ORAL_TABLET | ORAL | Status: DC

## 2014-12-04 NOTE — Patient Instructions (Signed)

## 2014-12-06 NOTE — Progress Notes (Signed)
Patient ID: William Willis, male   DOB: 1980-09-03, 34 y.o.   MRN: [id number]   Annual Preventative Comprehensive Examination   This very nice 34 y.o. MWM presents for complete physical.  Patient has no major health issues. He does have hx/o ADD and is doing well on prn doses of Adderall that he reserves for work days in improving his focusing & concentration. He also has hx/o anxiety and dysthymia and is stable and doing well on Buspar & Tegretol without wide swings in mood.   Finally, patient has history of Vitamin D Deficiency and last vitamin D was 37 in 3013.   Medication Sig  . ADDERALL 30 MG tablet Take 1/2 to 1 tablet 2 x day only if needed for focusing & concentration  . VITAMIN D 1000 UNITS Take 1,000 Units by mouth daily.  . cyanocobalamin 500 MCG tablet Take 500 mcg by mouth daily.  . busPIRone (BUSPAR) 10 MG tablet Take 1 tablet (10 mg total) by mouth 3 (three) times daily.  . carbamazepine (TEGRETOL) 200 MG  TAKE 1 TABLET BY MOUTH THREE TIMES DAILY  . meloxicam (MOBIC) 15 MG tablet Take only one pill daily with food.    No Known Allergies  Past Medical History  Diagnosis Date  . ADD (attention deficit disorder)   . Anxiety    Past Surgical History  Procedure Laterality Date  . Shoulder surgery Right 12-08-11   History   Social History  . Marital Status: Married    Spouse Name: N/A  . Number of Children: N/A  . Years of Education: N/A   Occupational History  . [employer] tech   Social History Main Topics  . Smoking status: Current Every Day Smoker -- 2.00 packs/day  . Smokeless tobacco: No  . Alcohol Use: Yes     Comment: rare  . Drug Use: No  . Sexual Activity: Active    ROS Constitutional: Denies fever, chills, weight loss/gain, headaches, insomnia, fatigue, night sweats, and change in appetite. Eyes: Denies redness, blurred vision, diplopia, discharge, itchy, watery eyes.  ENT: Denies discharge, congestion, post nasal drip, epistaxis, sore throat,  earache, hearing loss, dental pain, Tinnitus, Vertigo, Sinus pain, snoring.  Cardio: Denies chest pain, palpitations, irregular heartbeat, syncope, dyspnea, diaphoresis, orthopnea, PND, claudication, edema Respiratory: denies cough, dyspnea, DOE, pleurisy, hoarseness, laryngitis, wheezing.  Gastrointestinal: Denies dysphagia, heartburn, reflux, water brash, pain, cramps, nausea, vomiting, bloating, diarrhea, constipation, hematemesis, melena, hematochezia, jaundice, hemorrhoids Genitourinary: Denies dysuria, frequency, urgency, nocturia, hesitancy, discharge, hematuria, flank pain Musculoskeletal: Denies arthralgia, myalgia, stiffness, Jt. Swelling, pain, limp, and strain/sprain. Skin: Denies puritis, rash, hives, warts, acne, eczema, changing in skin lesion Neuro: Weakness, tremor, incoordination, spasms, paresthesia, pain Psychiatric: Denies confusion, memory loss, sensory loss. Endocrine: Denies change in weight, skin, hair change, nocturia, and paresthesia, diabetic polys, visual blurring, hyper /hypo glycemic episodes.  Heme/Lymph: No excessive bleeding, bruising, enlarged lymph nodes.   Physical Exam  BP 132/76 mmHg  Pulse 72  Temp(Src) 98.2 F (36.8 C)  Resp 16  Ht 5' 7.5" (1.715 m)  Wt 153 lb 3.2 oz (69.491 kg)  BMI 23.63 kg/m2  General Appearance: Well nourished and in no apparent distress. Eyes: PERRLA, EOMs, conjunctiva no swelling or erythema, normal fundi and vessels. Sinuses: No frontal/maxillary tenderness ENT/Mouth: EACs patent / TMs  nl. Nares clear without erythema, swelling, mucoid exudates. Oral hygiene is good. No erythema, swelling, or exudate. Tongue normal, non-obstructing. Tonsils not swollen or erythematous. Hearing normal.  Neck: Supple, thyroid normal. No bruits, nodes  or JVD. Respiratory: Respiratory effort normal.  BS equal and clear bilateral without rales, rhonci, wheezing or stridor. Cardio: Heart sounds are normal with regular rate and rhythm and no  murmurs, rubs or gallops. Peripheral pulses are normal and equal bilaterally without edema. No aortic or femoral bruits. Chest: symmetric with normal excursions and percussion.  Abdomen: Flat, soft, with bowl sounds. Nontender, no guarding, rebound, hernias, masses, or organomegaly.  Lymphatics: Non tender without lymphadenopathy.  Genitourinary: No hernia. Genitalia Nl.  Musculoskeletal: Full ROM all peripheral extremities, joint stability, 5/5 strength, and normal gait. Skin: Warm and dry without rashes, lesions, cyanosis, clubbing or  ecchymosis.  Neuro: Cranial nerves intact, reflexes equal bilaterally. Normal muscle tone, no cerebellar symptoms. Sensation intact.  Pysch: Awake and oriented X 3, normal affect, Insight and Judgment appropriate.   Assessment and Plan  1. ADD (attention deficit disorder)   2. Anxiety state   Continue prudent diet as discussed, weight control, regular exercise, and medications. Routine screening labs and tests as requested with regular follow-up as recommended.

## 2014-12-20 ENCOUNTER — Other Ambulatory Visit: Payer: Self-pay | Admitting: Internal Medicine

## 2014-12-24 ENCOUNTER — Other Ambulatory Visit: Payer: Self-pay | Admitting: Physician Assistant

## 2014-12-24 DIAGNOSIS — F988 Other specified behavioral and emotional disorders with onset usually occurring in childhood and adolescence: Secondary | ICD-10-CM

## 2014-12-24 MED ORDER — AMPHETAMINE-DEXTROAMPHETAMINE 30 MG PO TABS: ORAL_TABLET | ORAL | Status: DC

## 2015-01-20 ENCOUNTER — Encounter: Payer: Self-pay | Admitting: Physician Assistant

## 2015-01-20 ENCOUNTER — Other Ambulatory Visit: Payer: Self-pay | Admitting: Physician Assistant

## 2015-01-20 DIAGNOSIS — F988 Other specified behavioral and emotional disorders with onset usually occurring in childhood and adolescence: Secondary | ICD-10-CM

## 2015-01-20 MED ORDER — AMPHETAMINE-DEXTROAMPHETAMINE 30 MG PO TABS: ORAL_TABLET | ORAL | Status: DC

## 2015-02-08 ENCOUNTER — Encounter (HOSPITAL_COMMUNITY): Payer: Self-pay | Admitting: Nurse Practitioner

## 2015-02-08 ENCOUNTER — Emergency Department (HOSPITAL_COMMUNITY): Payer: Self-pay

## 2015-02-08 ENCOUNTER — Emergency Department (HOSPITAL_COMMUNITY): Payer: No Typology Code available for payment source

## 2015-02-08 ENCOUNTER — Emergency Department (HOSPITAL_COMMUNITY)
Admission: EM | Admit: 2015-02-08 | Discharge: 2015-02-08 | Disposition: A | Discharge status: Home / Self Care | Payer: Self-pay | Attending: Emergency Medicine | Admitting: Emergency Medicine

## 2015-02-08 DIAGNOSIS — S29001A Unspecified injury of muscle and tendon of front wall of thorax, initial encounter: Secondary | ICD-10-CM | POA: Insufficient documentation

## 2015-02-08 DIAGNOSIS — F419 Anxiety disorder, unspecified: Secondary | ICD-10-CM | POA: Insufficient documentation

## 2015-02-08 DIAGNOSIS — Y998 Other external cause status: Secondary | ICD-10-CM | POA: Insufficient documentation

## 2015-02-08 DIAGNOSIS — S99911A Unspecified injury of right ankle, initial encounter: Secondary | ICD-10-CM | POA: Insufficient documentation

## 2015-02-08 DIAGNOSIS — S3992XA Unspecified injury of lower back, initial encounter: Secondary | ICD-10-CM | POA: Insufficient documentation

## 2015-02-08 DIAGNOSIS — Z79899 Other long term (current) drug therapy: Secondary | ICD-10-CM | POA: Insufficient documentation

## 2015-02-08 DIAGNOSIS — R52 Pain, unspecified: Secondary | ICD-10-CM

## 2015-02-08 DIAGNOSIS — Z23 Encounter for immunization: Secondary | ICD-10-CM | POA: Insufficient documentation

## 2015-02-08 DIAGNOSIS — S023XXA Fracture of orbital floor, initial encounter for closed fracture: Secondary | ICD-10-CM | POA: Insufficient documentation

## 2015-02-08 DIAGNOSIS — Y9289 Other specified places as the place of occurrence of the external cause: Secondary | ICD-10-CM | POA: Insufficient documentation

## 2015-02-08 DIAGNOSIS — R937 Abnormal findings on diagnostic imaging of other parts of musculoskeletal system: Secondary | ICD-10-CM

## 2015-02-08 DIAGNOSIS — F909 Attention-deficit hyperactivity disorder, unspecified type: Secondary | ICD-10-CM | POA: Insufficient documentation

## 2015-02-08 DIAGNOSIS — S60511A Abrasion of right hand, initial encounter: Secondary | ICD-10-CM | POA: Insufficient documentation

## 2015-02-08 DIAGNOSIS — S6991XA Unspecified injury of right wrist, hand and finger(s), initial encounter: Secondary | ICD-10-CM | POA: Insufficient documentation

## 2015-02-08 DIAGNOSIS — Z720 Tobacco use: Secondary | ICD-10-CM | POA: Insufficient documentation

## 2015-02-08 DIAGNOSIS — Y9389 Activity, other specified: Secondary | ICD-10-CM | POA: Insufficient documentation

## 2015-02-08 DIAGNOSIS — S0230XA Fracture of orbital floor, unspecified side, initial encounter for closed fracture: Secondary | ICD-10-CM

## 2015-02-08 DIAGNOSIS — Z791 Long term (current) use of non-steroidal anti-inflammatories (NSAID): Secondary | ICD-10-CM | POA: Insufficient documentation

## 2015-02-08 DIAGNOSIS — T148XXA Other injury of unspecified body region, initial encounter: Secondary | ICD-10-CM

## 2015-02-08 MED ORDER — IBUPROFEN 800 MG PO TABS
800.0000 mg | ORAL_TABLET | Freq: Once | ORAL | Status: AC
  Administered 2015-02-08: 800 mg via ORAL
  Filled 2015-02-08: qty 1

## 2015-02-08 MED ORDER — TETANUS-DIPHTH-ACELL PERTUSSIS 5-2.5-18.5 LF-MCG/0.5 IM SUSP
0.5000 mL | Freq: Once | INTRAMUSCULAR | Status: AC
  Administered 2015-02-08: 0.5 mL via INTRAMUSCULAR
  Filled 2015-02-08: qty 0.5

## 2015-02-08 MED ORDER — BUSPIRONE HCL 10 MG PO TABS
10.0000 mg | ORAL_TABLET | Freq: Once | ORAL | Status: AC
  Administered 2015-02-08: 10 mg via ORAL
  Filled 2015-02-08: qty 1

## 2015-02-08 MED ORDER — BACITRACIN 500 UNIT/GM EX OINT
1.0000 "application " | TOPICAL_OINTMENT | Freq: Two times a day (BID) | CUTANEOUS | Status: DC
  Filled 2015-02-08 (×2): qty 0.9

## 2015-02-08 MED ORDER — AMPHETAMINE-DEXTROAMPHETAMINE 10 MG PO TABS: 30.0000 mg | ORAL_TABLET | Freq: Every day | ORAL | Status: DC

## 2015-02-08 MED ORDER — CARBAMAZEPINE 200 MG PO TABS
200.0000 mg | ORAL_TABLET | Freq: Once | ORAL | Status: AC
  Administered 2015-02-08: 200 mg via ORAL
  Filled 2015-02-08: qty 1
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
## 2015-02-08 NOTE — ED Notes (Signed)
PA at bedside.

## 2015-02-08 NOTE — ED Notes (Signed)
Pt placed in gown and in bed. Pt monitored by pulse ox, bp cuff, and 5-lead. 
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
## 2015-02-08 NOTE — ED Notes (Signed)
Pt transported to CT and x-ray  

## 2015-02-08 NOTE — ED Notes (Signed)
c-collar applied  

## 2015-02-08 NOTE — ED Notes (Signed)
Pt transported to MRI 

## 2015-02-08 NOTE — Discharge Instructions (Signed)
DO NOT BLOW YOUR NOSE

## 2015-02-24 ENCOUNTER — Other Ambulatory Visit: Payer: Self-pay | Admitting: Physician Assistant

## 2015-02-24 DIAGNOSIS — F988 Other specified behavioral and emotional disorders with onset usually occurring in childhood and adolescence: Secondary | ICD-10-CM

## 2015-02-24 MED ORDER — AMPHETAMINE-DEXTROAMPHETAMINE 30 MG PO TABS: ORAL_TABLET | ORAL | Status: DC

## 2015-03-24 ENCOUNTER — Other Ambulatory Visit: Payer: Self-pay | Admitting: Physician Assistant

## 2015-03-24 DIAGNOSIS — F988 Other specified behavioral and emotional disorders with onset usually occurring in childhood and adolescence: Secondary | ICD-10-CM

## 2015-03-24 MED ORDER — AMPHETAMINE-DEXTROAMPHETAMINE 30 MG PO TABS: ORAL_TABLET | ORAL | Status: DC

## 2015-04-22 ENCOUNTER — Other Ambulatory Visit: Payer: Self-pay | Admitting: Physician Assistant

## 2015-04-22 DIAGNOSIS — F988 Other specified behavioral and emotional disorders with onset usually occurring in childhood and adolescence: Secondary | ICD-10-CM

## 2015-04-22 MED ORDER — AMPHETAMINE-DEXTROAMPHETAMINE 30 MG PO TABS: ORAL_TABLET | ORAL | Status: DC

## 2015-05-27 ENCOUNTER — Other Ambulatory Visit: Payer: Self-pay | Admitting: *Deleted

## 2015-05-27 DIAGNOSIS — F988 Other specified behavioral and emotional disorders with onset usually occurring in childhood and adolescence: Secondary | ICD-10-CM

## 2015-05-27 MED ORDER — AMPHETAMINE-DEXTROAMPHETAMINE 30 MG PO TABS: ORAL_TABLET | ORAL | Status: DC

## 2015-06-05 ENCOUNTER — Ambulatory Visit (INDEPENDENT_AMBULATORY_CARE_PROVIDER_SITE_OTHER): Payer: Self-pay | Admitting: Physician Assistant

## 2015-06-05 ENCOUNTER — Encounter: Payer: Self-pay | Admitting: Physician Assistant

## 2015-06-05 VITALS — BP 126/82 | HR 87 | Temp 97.9°F | Resp 16 | Ht 67.5 in | Wt 158.0 lb

## 2015-06-05 DIAGNOSIS — F988 Other specified behavioral and emotional disorders with onset usually occurring in childhood and adolescence: Secondary | ICD-10-CM

## 2015-06-05 DIAGNOSIS — F411 Generalized anxiety disorder: Secondary | ICD-10-CM

## 2015-06-05 DIAGNOSIS — F909 Attention-deficit hyperactivity disorder, unspecified type: Secondary | ICD-10-CM

## 2015-06-05 DIAGNOSIS — E559 Vitamin D deficiency, unspecified: Secondary | ICD-10-CM
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
## 2015-06-09 ENCOUNTER — Ambulatory Visit: Payer: Self-pay | Admitting: Physician Assistant

## 2015-06-25 ENCOUNTER — Other Ambulatory Visit: Payer: Self-pay | Admitting: *Deleted

## 2015-06-25 DIAGNOSIS — F988 Other specified behavioral and emotional disorders with onset usually occurring in childhood and adolescence: Secondary | ICD-10-CM

## 2015-06-25 MED ORDER — AMPHETAMINE-DEXTROAMPHETAMINE 30 MG PO TABS: ORAL_TABLET | ORAL | Status: DC

## 2015-07-22 ENCOUNTER — Other Ambulatory Visit: Payer: Self-pay | Admitting: Physician Assistant

## 2015-07-22 DIAGNOSIS — F988 Other specified behavioral and emotional disorders with onset usually occurring in childhood and adolescence: Secondary | ICD-10-CM

## 2015-07-22 MED ORDER — AMPHETAMINE-DEXTROAMPHETAMINE 30 MG PO TABS: ORAL_TABLET | ORAL | Status: DC

## 2015-08-25 ENCOUNTER — Other Ambulatory Visit: Payer: Self-pay | Admitting: Physician Assistant

## 2015-08-25 DIAGNOSIS — F988 Other specified behavioral and emotional disorders with onset usually occurring in childhood and adolescence: Secondary | ICD-10-CM

## 2015-08-25 MED ORDER — AMPHETAMINE-DEXTROAMPHETAMINE 30 MG PO TABS: ORAL_TABLET | ORAL | Status: DC

## 2015-09-15 ENCOUNTER — Other Ambulatory Visit: Payer: Self-pay | Admitting: *Deleted

## 2015-09-15 ENCOUNTER — Other Ambulatory Visit: Payer: Self-pay | Admitting: Internal Medicine

## 2015-09-15 DIAGNOSIS — F341 Dysthymic disorder: Secondary | ICD-10-CM

## 2015-09-15 MED ORDER — CARBAMAZEPINE 200 MG PO TABS: 200.0000 mg | ORAL_TABLET | Freq: Three times a day (TID) | ORAL | Status: DC

## 2015-09-25 ENCOUNTER — Other Ambulatory Visit: Payer: Self-pay | Admitting: Internal Medicine

## 2015-09-25 DIAGNOSIS — F988 Other specified behavioral and emotional disorders with onset usually occurring in childhood and adolescence: Secondary | ICD-10-CM

## 2015-09-25 MED ORDER — AMPHETAMINE-DEXTROAMPHETAMINE 30 MG PO TABS: ORAL_TABLET | ORAL | Status: DC

## 2015-09-29 ENCOUNTER — Encounter: Payer: Self-pay | Admitting: Internal Medicine

## 2015-09-30 ENCOUNTER — Other Ambulatory Visit: Payer: Self-pay | Admitting: Internal Medicine

## 2015-11-04 ENCOUNTER — Other Ambulatory Visit: Payer: Self-pay | Admitting: Physician Assistant

## 2015-11-04 ENCOUNTER — Other Ambulatory Visit: Payer: Self-pay | Admitting: Internal Medicine

## 2015-11-04 DIAGNOSIS — F988 Other specified behavioral and emotional disorders with onset usually occurring in childhood and adolescence: Secondary | ICD-10-CM

## 2015-11-04 MED ORDER — AMPHETAMINE-DEXTROAMPHETAMINE 30 MG PO TABS: ORAL_TABLET | ORAL | Status: DC

## 2015-11-13 ENCOUNTER — Other Ambulatory Visit: Payer: Self-pay | Admitting: Internal Medicine

## 2015-11-23 ENCOUNTER — Encounter: Payer: Self-pay | Admitting: Internal Medicine

## 2015-11-27 ENCOUNTER — Encounter (HOSPITAL_COMMUNITY): Payer: Self-pay | Admitting: *Deleted

## 2015-11-27 ENCOUNTER — Emergency Department (HOSPITAL_COMMUNITY): Payer: No Typology Code available for payment source

## 2015-11-27 ENCOUNTER — Other Ambulatory Visit: Payer: Self-pay | Admitting: Internal Medicine

## 2015-11-27 ENCOUNTER — Emergency Department (HOSPITAL_COMMUNITY)
Admission: EM | Admit: 2015-11-27 | Discharge: 2015-11-27 | Disposition: A | Discharge status: Home / Self Care | Payer: No Typology Code available for payment source | Attending: Emergency Medicine | Admitting: Emergency Medicine

## 2015-11-27 ENCOUNTER — Emergency Department (HOSPITAL_COMMUNITY): Payer: Self-pay

## 2015-11-27 DIAGNOSIS — S0081XA Abrasion of other part of head, initial encounter: Secondary | ICD-10-CM | POA: Insufficient documentation

## 2015-11-27 DIAGNOSIS — Y9389 Activity, other specified: Secondary | ICD-10-CM | POA: Insufficient documentation

## 2015-11-27 DIAGNOSIS — Y999 Unspecified external cause status: Secondary | ICD-10-CM | POA: Insufficient documentation

## 2015-11-27 DIAGNOSIS — S0083XA Contusion of other part of head, initial encounter: Secondary | ICD-10-CM | POA: Insufficient documentation

## 2015-11-27 DIAGNOSIS — Y92410 Unspecified street and highway as the place of occurrence of the external cause: Secondary | ICD-10-CM | POA: Insufficient documentation

## 2015-11-27 DIAGNOSIS — S6991XA Unspecified injury of right wrist, hand and finger(s), initial encounter: Secondary | ICD-10-CM | POA: Insufficient documentation

## 2015-11-27 DIAGNOSIS — F419 Anxiety disorder, unspecified: Secondary | ICD-10-CM | POA: Insufficient documentation

## 2015-11-27 DIAGNOSIS — F17200 Nicotine dependence, unspecified, uncomplicated: Secondary | ICD-10-CM | POA: Insufficient documentation

## 2015-11-27 DIAGNOSIS — Z79899 Other long term (current) drug therapy: Secondary | ICD-10-CM | POA: Insufficient documentation

## 2015-11-27 DIAGNOSIS — F909 Attention-deficit hyperactivity disorder, unspecified type: Secondary | ICD-10-CM | POA: Insufficient documentation

## 2015-11-27 MED ORDER — HYDROCODONE-ACETAMINOPHEN 5-325 MG PO TABS: 1.0000 | ORAL_TABLET | ORAL | Status: DC | PRN

## 2015-11-27 MED ORDER — CYCLOBENZAPRINE HCL 10 MG PO TABS: 10.0000 mg | ORAL_TABLET | Freq: Two times a day (BID) | ORAL | Status: DC | PRN

## 2015-11-27 MED ORDER — HYDROCODONE-ACETAMINOPHEN 5-325 MG PO TABS
1.0000 | ORAL_TABLET | Freq: Once | ORAL | Status: AC
Start: 2015-11-27 — End: 2015-11-27
  Administered 2015-11-27: 1 via ORAL
  Filled 2015-11-27: qty 1

## 2015-11-27 MED ORDER — TRAMADOL HCL 50 MG PO TABS: 50.0000 mg | ORAL_TABLET | Freq: Four times a day (QID) | ORAL | Status: DC | PRN
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
## 2015-11-27 NOTE — Discharge Instructions (Signed)
Cryotherapy °Cryotherapy means treatment with cold. Ice or gel packs can be used to reduce both pain and swelling. Ice is the most helpful within the first 24 to 48 hours after an injury or flare-up from overusing a muscle or joint. Sprains, strains, spasms, burning pain, shooting pain, and aches can all be eased with ice. Ice can also be used when recovering from surgery. Ice is effective, has very few side effects, and is safe for most people to use. °PRECAUTIONS  °Ice is not a safe treatment option for people with: °· Raynaud phenomenon. This is a condition affecting small blood vessels in the extremities. Exposure to cold may cause your problems to return. °· Cold hypersensitivity. There are many forms of cold hypersensitivity, including: °¨ Cold urticaria. Red, itchy hives appear on the skin when the tissues begin to warm after being iced. °¨ Cold erythema. This is a red, itchy rash caused by exposure to cold. °¨ Cold hemoglobinuria. Red blood cells break down when the tissues begin to warm after being iced. The hemoglobin that carry oxygen are passed into the urine because they cannot combine with blood proteins fast enough. °· Numbness or altered sensitivity in the area being iced. °If you have any of the following conditions, do not use ice until you have discussed cryotherapy with your caregiver: °· Heart conditions, such as arrhythmia, angina, or chronic heart disease. °· High blood pressure. °· Healing wounds or open skin in the area being iced. °· Current infections. °· Rheumatoid arthritis. °· Poor circulation. °· Diabetes. °Ice slows the blood flow in the region it is applied. This is beneficial when trying to stop inflamed tissues from spreading irritating chemicals to surrounding tissues. However, if you expose your skin to cold temperatures for too long or without the proper protection, you can damage your skin or nerves. Watch for signs of skin damage due to cold. °HOME CARE INSTRUCTIONS °Follow  these tips to use ice and cold packs safely. °· Place a dry or damp towel between the ice and skin. A damp towel will cool the skin more quickly, so you may need to shorten the time that the ice is used. °· For a more rapid response, add gentle compression to the ice. °· Ice for no more than 10 to 20 minutes at a time. The bonier the area you are icing, the less time it will take to get the benefits of ice. °· Check your skin after 5 minutes to make sure there are no signs of a poor response to cold or skin damage. °· Rest 20 minutes or more between uses. °· Once your skin is numb, you can end your treatment. You can test numbness by very lightly touching your skin. The touch should be so light that you do not see the skin dimple from the pressure of your fingertip. When using ice, most people will feel these normal sensations in this order: cold, burning, aching, and numbness. °· Do not use ice on someone who cannot communicate their responses to pain, such as small children or people with dementia. °HOW TO MAKE AN ICE PACK °Ice packs are the most common way to use ice therapy. Other methods include ice massage, ice baths, and cryosprays. Muscle creams that cause a cold, tingly feeling do not offer the same benefits that ice offers and should not be used as a substitute unless recommended by your caregiver. °To make an ice pack, do one of the following: °· Place crushed ice or a   bag of frozen vegetables in a sealable plastic bag. Squeeze out the excess air. Place this bag inside another plastic bag. Slide the bag into a pillowcase or place a damp towel between your skin and the bag.  Mix 3 parts water with 1 part rubbing alcohol. Freeze the mixture in a sealable plastic bag. When you remove the mixture from the freezer, it will be slushy. Squeeze out the excess air. Place this bag inside another plastic bag. Slide the bag into a pillowcase or place a damp towel between your skin and the bag. SEEK MEDICAL CARE  IF:  You develop white spots on your skin. This may give the skin a blotchy (mottled) appearance.  Your skin turns blue or pale.  Your skin becomes waxy or hard.  Your swelling gets worse. MAKE SURE YOU:   Understand these instructions.  Will watch your condition.  Will get help right away if you are not doing well or get worse.   This information is not intended to replace advice given to you by your health care provider. Make sure you discuss any questions you have with your health care provider.   Document Released: 04/04/2011 Document Revised: 08/29/2014 Document Reviewed: 04/04/2011 Elsevier Interactive Patient Education 2016 [employer]  Facial or Scalp Contusion A facial or scalp contusion is a deep bruise on the face or head. Injuries to the face and head generally cause a lot of swelling, especially around the eyes. Contusions are the result of an injury that caused bleeding under the skin. The contusion may turn blue, purple, or yellow. Minor injuries will give you a painless contusion, but more severe contusions may stay painful and swollen for a few weeks.  CAUSES  A facial or scalp contusion is caused by a blunt injury or trauma to the face or head area.  SIGNS AND SYMPTOMS   Swelling of the injured area.   Discoloration of the injured area.   Tenderness, soreness, or pain in the injured area.  DIAGNOSIS  The diagnosis can be made by taking a medical history and doing a physical exam. An X-ray exam, CT scan, or MRI may be needed to determine if there are any associated injuries, such as broken bones (fractures). TREATMENT  Often, the best treatment for a facial or scalp contusion is applying cold compresses to the injured area. Over-the-counter medicines may also be recommended for pain control.  HOME CARE INSTRUCTIONS   Only take over-the-counter or prescription medicines as directed by your health care provider.   Apply ice to the injured area.   Put  ice in a plastic bag.   Place a towel between your skin and the bag.   Leave the ice on for 20 minutes, 2-3 times a day.  SEEK MEDICAL CARE IF:  You have bite problems.   You have pain with chewing.   You are concerned about facial defects. SEEK IMMEDIATE MEDICAL CARE IF:  You have severe pain or a headache that is not relieved by medicine.   You have unusual sleepiness, confusion, or personality changes.   You throw up (vomit).   You have a persistent nosebleed.   You have double vision or blurred vision.   You have fluid drainage from your nose or ear.   You have difficulty walking or using your arms or legs.  MAKE SURE YOU:   Understand these instructions.  Will watch your condition.  Will get help right away if you are not doing well or get worse.  This information is not intended to replace advice given to you by your health care provider. Make sure you discuss any questions you have with your health care provider.   Document Released: 09/15/2004 Document Revised: 08/29/2014 Document Reviewed: 03/21/2013 Elsevier Interactive Patient Education 2016 [employer] Tourist information centre managerMotor Vehicle Collision It is common to have multiple bruises and sore muscles after a motor vehicle collision (MVC). These tend to feel worse for the first 24 hours. You may have the most stiffness and soreness over the first several hours. You may also feel worse when you wake up the first morning after your collision. After this point, you will usually begin to improve with each day. The speed of improvement often depends on the severity of the collision, the number of injuries, and the location and nature of these injuries. HOME CARE INSTRUCTIONS  Put ice on the injured area.  Put ice in a plastic bag.  Place a towel between your skin and the bag.  Leave the ice on for 15-20 minutes, 3-4 times a day, or as directed by your health care provider.  Drink enough fluids to keep your urine  clear or pale yellow. Do not drink alcohol.  Take a warm shower or bath once or twice a day. This will increase blood flow to sore muscles.  You may return to activities as directed by your caregiver. Be careful when lifting, as this may aggravate neck or back pain.  Only take over-the-counter or prescription medicines for pain, discomfort, or fever as directed by your caregiver. Do not use aspirin. This may increase bruising and bleeding. SEEK IMMEDIATE MEDICAL CARE IF:  You have numbness, tingling, or weakness in the arms or legs.  You develop severe headaches not relieved with medicine.  You have severe neck pain, especially tenderness in the middle of the back of your neck.  You have changes in bowel or bladder control.  There is increasing pain in any area of the body.  You have shortness of breath, light-headedness, dizziness, or fainting.  You have chest pain.  You feel sick to your stomach (nauseous), throw up (vomit), or sweat.  You have increasing abdominal discomfort.  There is blood in your urine, stool, or vomit.  You have pain in your shoulder (shoulder strap areas).  You feel your symptoms are getting worse. MAKE SURE YOU:  Understand these instructions.  Will watch your condition.  Will get help right away if you are not doing well or get worse.   This information is not intended to replace advice given to you by your health care provider. Make sure you discuss any questions you have with your health care provider.   Document Released: 08/08/2005 Document Revised: 08/29/2014 Document Reviewed: 01/05/2011 Elsevier Interactive Patient Education [employer]
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
## 2015-12-01 ENCOUNTER — Ambulatory Visit (INDEPENDENT_AMBULATORY_CARE_PROVIDER_SITE_OTHER): Payer: Self-pay | Admitting: Internal Medicine

## 2015-12-01 VITALS — BP 130/82 | HR 64 | Temp 97.7°F | Resp 16 | Ht 67.5 in | Wt 154.8 lb

## 2015-12-01 DIAGNOSIS — F0781 Postconcussional syndrome: Secondary | ICD-10-CM

## 2015-12-01 DIAGNOSIS — S0083XD Contusion of other part of head, subsequent encounter: Secondary | ICD-10-CM

## 2015-12-01 DIAGNOSIS — M25531 Pain in right wrist: Secondary | ICD-10-CM

## 2015-12-01 MED ORDER — AMITRIPTYLINE HCL 25 MG PO TABS: 25.0000 mg | ORAL_TABLET | Freq: Every day | ORAL | Status: DC

## 2015-12-01 MED ORDER — MELOXICAM 15 MG PO TABS: 15.0000 mg | ORAL_TABLET | Freq: Every day | ORAL | Status: DC

## 2015-12-01 NOTE — Patient Instructions (Signed)
Please take elavil 1 tablet at bedtime to help with headaches caused by the concussion.  Do not mix this medication with alcohol.  Please take 1 meloxicam tablet with food to help with inflammation.  Please get a thumb spica splint that you can buy over the counter.  Wear this all the time to help with your wrist pain.  Get your xray next Friday at the [facility].  Dr. Georgie Chard is the dentist who you can try to get an appointment with.    Post-Concussion Syndrome Post-concussion syndrome describes the symptoms that can occur after a head injury. These symptoms can last from weeks to months. CAUSES  It is not clear why some head injuries cause post-concussion syndrome. It can occur whether your head injury was mild or severe and whether you were wearing head protection or not.  SIGNS AND SYMPTOMS  Memory difficulties.  Dizziness.  Headaches.  Double vision or blurry vision.  Sensitivity to light.  Hearing difficulties.  Depression.  Tiredness.  Weakness.  Difficulty with concentration.  Difficulty sleeping or staying asleep.  Vomiting.  Poor balance or instability on your feet.  Slow reaction time.  Difficulty learning and remembering things you have heard. DIAGNOSIS  There is no test to determine whether you have post-concussion syndrome. Your health care provider may order an imaging scan of your brain, such as a CT scan, to check for other problems that may be causing your symptoms (such as a severe injury inside your skull). TREATMENT  Usually, these problems disappear over time without medical care. Your health care provider may prescribe medicine to help ease your symptoms. It is important to follow up with a neurologist to evaluate your recovery and address any lingering symptoms or issues. HOME CARE INSTRUCTIONS   Take medicines only as directed by your health care provider. Do not take aspirin. Aspirin can slow blood clotting.  Sleep with your head  slightly elevated to help with headaches.  Avoid any situation where there is potential for another head injury. This includes football, hockey, soccer, basketball, martial arts, downhill snow sports, and horseback riding. Your condition will get worse every time you experience a concussion. You should avoid these activities until you are evaluated by the appropriate follow-up health care providers.  Keep all follow-up visits as directed by your health care provider. This is important. SEEK MEDICAL CARE IF:  You have increased problems paying attention or concentrating.  You have increased difficulty remembering or learning new information.  You need more time to complete tasks or assignments than before.  You have increased irritability or decreased ability to cope with stress.  You have more symptoms than before. Seek medical care if you have any of the following symptoms for more than two weeks after your injury:  Lasting (chronic) headaches.  Dizziness or balance problems.  Nausea.  Vision problems.  Increased sensitivity to noise or light.  Depression or mood swings.  Anxiety or irritability.  Memory problems.  Difficulty concentrating or paying attention.  Sleep problems.  Feeling tired all the time. SEEK IMMEDIATE MEDICAL CARE IF:  You have confusion or unusual drowsiness.  Others find it difficult to wake you up.  You have nausea or persistent, forceful vomiting.  You feel like you are moving when you are not (vertigo). Your eyes may move rapidly back and forth.  You have convulsions or faint.  You have severe, persistent headaches that are not relieved by medicine.  You cannot use your arms or legs normally.  One of your pupils is larger than the other.  You have clear or bloody discharge from your nose or ears.  Your problems are getting worse, not better. MAKE SURE YOU:  Understand these instructions.  Will watch your condition.  Will get  help right away if you are not doing well or get worse.   This information is not intended to replace advice given to you by your health care provider. Make sure you discuss any questions you have with your health care provider.   Document Released: 01/28/2002 Document Revised: 08/29/2014 Document Reviewed: 11/13/2013 Elsevier Interactive Patient Education [employer]2016 Elsevier Inc.
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
## 2015-12-10 ENCOUNTER — Emergency Department (HOSPITAL_BASED_OUTPATIENT_CLINIC_OR_DEPARTMENT_OTHER)
Admission: EM | Admit: 2015-12-10 | Discharge: 2015-12-10 | Disposition: A | Discharge status: Home / Self Care | Payer: No Typology Code available for payment source | Attending: Emergency Medicine | Admitting: Emergency Medicine

## 2015-12-10 ENCOUNTER — Ambulatory Visit (HOSPITAL_COMMUNITY)
Admission: RE | Admit: 2015-12-10 | Discharge: 2015-12-10 | Disposition: A | Discharge status: Home / Self Care | Payer: Self-pay | Source: Ambulatory Visit | Attending: Internal Medicine | Admitting: Internal Medicine

## 2015-12-10 ENCOUNTER — Encounter (HOSPITAL_BASED_OUTPATIENT_CLINIC_OR_DEPARTMENT_OTHER): Payer: Self-pay | Admitting: *Deleted

## 2015-12-10 DIAGNOSIS — F0781 Postconcussional syndrome: Secondary | ICD-10-CM | POA: Insufficient documentation

## 2015-12-10 DIAGNOSIS — F419 Anxiety disorder, unspecified: Secondary | ICD-10-CM | POA: Insufficient documentation

## 2015-12-10 DIAGNOSIS — H1131 Conjunctival hemorrhage, right eye: Secondary | ICD-10-CM | POA: Insufficient documentation

## 2015-12-10 DIAGNOSIS — M25531 Pain in right wrist: Secondary | ICD-10-CM | POA: Insufficient documentation

## 2015-12-10 DIAGNOSIS — F17210 Nicotine dependence, cigarettes, uncomplicated: Secondary | ICD-10-CM | POA: Insufficient documentation

## 2015-12-10 DIAGNOSIS — G44309 Post-traumatic headache, unspecified, not intractable: Secondary | ICD-10-CM | POA: Insufficient documentation

## 2015-12-10 DIAGNOSIS — Z791 Long term (current) use of non-steroidal anti-inflammatories (NSAID): Secondary | ICD-10-CM | POA: Insufficient documentation

## 2015-12-10 DIAGNOSIS — F909 Attention-deficit hyperactivity disorder, unspecified type: Secondary | ICD-10-CM | POA: Insufficient documentation

## 2015-12-10 DIAGNOSIS — Z79899 Other long term (current) drug therapy: Secondary | ICD-10-CM | POA: Insufficient documentation

## 2015-12-10 MED ORDER — HYDROCODONE-ACETAMINOPHEN 5-325 MG PO TABS
2.0000 | ORAL_TABLET | Freq: Once | ORAL | Status: AC
  Administered 2015-12-10: 2 via ORAL
  Filled 2015-12-10: qty 2

## 2015-12-10 MED ORDER — HYDROCODONE-ACETAMINOPHEN 5-325 MG PO TABS: 1.0000 | ORAL_TABLET | Freq: Four times a day (QID) | ORAL | Status: DC | PRN

## 2015-12-10 MED FILL — HYDROCODON-APAP 5-325: 5-325 | 3 days supply | Qty: 20 | Fill #0
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
## 2015-12-10 NOTE — Discharge Instructions (Signed)
Post-Concussion Syndrome Take Tylenol for mild pain or the pain medicine prescribed for bad pain. Don't take Tylenol together with the pain medicine prescribed as the accommodation be dangerous to your liver. Call [facility] or [facility] [place] [facility] to schedule an office visit if continue to have significant discomfort in a week. Post-concussion syndrome is the symptoms that can occur after a head injury. These symptoms can last from weeks to months. HOME CARE   Take medicines only as told by your doctor.  Do not take aspirin.  Sleep with your head raised to help with headaches.  Avoid activities that can cause another head injury.  Do not play contact sports like football, hockey, soccer, or basketball.  Do not do other risky activities like downhill skiing, martial arts, or horseback riding until your doctor says it is okay.  Keep all follow-up visits as told by your doctor. This is important. GET HELP IF:   You have a harder time:  Paying attention.  Focusing.  Remembering.  Learning new information.  Dealing with stress.  You need more time to complete tasks.  You are easily bothered (irritable).  You have more symptoms. Get help if you have any of these symptoms for more than two weeks after your injury:   Long-lasting (chronic) headaches.  Dizziness.  Trouble balancing.  Feeling sick to your stomach (nauseous).  Trouble with your vision.  Noise or light bothers you more.  Depression.  Mood swings.  Feeling worried (anxious).  Easily bothered.  Memory problems.  Trouble concentrating or paying attention.  Sleep problems.  Feeling tired all of the time. GET HELP RIGHT AWAY IF:  You feel confused.  You feel very sleepy.  You are hard to wake up.  You feel sick to your stomach.  You keep throwing up (vomiting).  You feel like you are moving when you are not (vertigo).  Your eyes move back and forth very quickly.  You  start shaking (convulsing) or pass out (faint).  You have very bad headaches that do not get better with medicine.  You cannot use your arms or legs like normal.  One of the black centers of your eyes (pupils) is bigger than the other.  You have clear or bloody fluid coming from your nose or ears.  Your problems get worse, not better. MAKE SURE YOU:  Understand these instructions.  Will watch your condition.  Will get help right away if you are not doing well or get worse.   This information is not intended to replace advice given to you by your health care provider. Make sure you discuss any questions you have with your health care provider.   Document Released: 09/15/2004 Document Revised: 08/29/2014 Document Reviewed: 11/13/2013 Elsevier Interactive Patient Education [employer]2016 Elsevier Inc.
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
## 2015-12-11 ENCOUNTER — Other Ambulatory Visit: Payer: Self-pay | Admitting: Internal Medicine

## 2015-12-11 ENCOUNTER — Ambulatory Visit (INDEPENDENT_AMBULATORY_CARE_PROVIDER_SITE_OTHER): Payer: Self-pay | Admitting: Internal Medicine

## 2015-12-11 ENCOUNTER — Encounter: Payer: Self-pay | Admitting: Internal Medicine

## 2015-12-11 ENCOUNTER — Telehealth: Payer: Self-pay | Admitting: *Deleted

## 2015-12-11 VITALS — BP 110/76 | HR 76 | Temp 97.7°F | Resp 16 | Ht 67.5 in | Wt 161.0 lb

## 2015-12-11 DIAGNOSIS — F988 Other specified behavioral and emotional disorders with onset usually occurring in childhood and adolescence: Secondary | ICD-10-CM

## 2015-12-11 DIAGNOSIS — F909 Attention-deficit hyperactivity disorder, unspecified type: Secondary | ICD-10-CM

## 2015-12-11 DIAGNOSIS — S161XXD Strain of muscle, fascia and tendon at neck level, subsequent encounter: Secondary | ICD-10-CM

## 2015-12-11 DIAGNOSIS — M26629 Arthralgia of temporomandibular joint, unspecified side: Secondary | ICD-10-CM

## 2015-12-11 MED ORDER — PREDNISONE 20 MG PO TABS: ORAL_TABLET | ORAL | Status: DC

## 2015-12-11 MED ORDER — AMPHETAMINE-DEXTROAMPHETAMINE 30 MG PO TABS: ORAL_TABLET | ORAL | Status: DC
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
## 2015-12-11 NOTE — Telephone Encounter (Signed)
error 

## 2015-12-14 ENCOUNTER — Encounter: Payer: Self-pay | Admitting: Internal Medicine

## 2016-01-19 ENCOUNTER — Other Ambulatory Visit: Payer: Self-pay | Admitting: *Deleted

## 2016-01-19 ENCOUNTER — Other Ambulatory Visit: Payer: Self-pay | Admitting: Internal Medicine

## 2016-01-19 DIAGNOSIS — F411 Generalized anxiety disorder: Secondary | ICD-10-CM

## 2016-01-19 DIAGNOSIS — F988 Other specified behavioral and emotional disorders with onset usually occurring in childhood and adolescence: Secondary | ICD-10-CM

## 2016-01-19 MED ORDER — AMPHETAMINE-DEXTROAMPHETAMINE 30 MG PO TABS: ORAL_TABLET | ORAL | Status: DC

## 2016-02-18 ENCOUNTER — Other Ambulatory Visit: Payer: Self-pay | Admitting: Physician Assistant

## 2016-02-18 DIAGNOSIS — F988 Other specified behavioral and emotional disorders with onset usually occurring in childhood and adolescence: Secondary | ICD-10-CM

## 2016-02-18 MED ORDER — AMPHETAMINE-DEXTROAMPHETAMINE 30 MG PO TABS: ORAL_TABLET | ORAL | Status: DC

## 2016-03-30 ENCOUNTER — Other Ambulatory Visit: Payer: Self-pay | Admitting: *Deleted

## 2016-03-30 DIAGNOSIS — F988 Other specified behavioral and emotional disorders with onset usually occurring in childhood and adolescence: Secondary | ICD-10-CM

## 2016-03-30 MED ORDER — AMPHETAMINE-DEXTROAMPHETAMINE 30 MG PO TABS: ORAL_TABLET | ORAL | 0 refills | Status: DC

## 2016-04-11 ENCOUNTER — Other Ambulatory Visit: Payer: Self-pay | Admitting: Internal Medicine

## 2016-04-11 DIAGNOSIS — F3170 Bipolar disorder, currently in remission, most recent episode unspecified: Secondary | ICD-10-CM

## 2016-04-11 MED ORDER — LAMOTRIGINE 200 MG PO TABS: ORAL_TABLET | ORAL | 2 refills | Status: DC

## 2016-04-12 ENCOUNTER — Other Ambulatory Visit: Payer: Self-pay | Admitting: *Deleted

## 2016-04-12 DIAGNOSIS — F3170 Bipolar disorder, currently in remission, most recent episode unspecified: Secondary | ICD-10-CM

## 2016-04-12 MED ORDER — LAMOTRIGINE 200 MG PO TABS: ORAL_TABLET | ORAL | 2 refills | Status: DC

## 2016-04-26 ENCOUNTER — Other Ambulatory Visit: Payer: Self-pay | Admitting: Internal Medicine

## 2016-04-26 DIAGNOSIS — F3173 Bipolar disorder, in partial remission, most recent episode manic: Secondary | ICD-10-CM

## 2016-04-26 MED ORDER — AMITRIPTYLINE HCL 25 MG PO TABS: 25.0000 mg | ORAL_TABLET | Freq: Every day | ORAL | 0 refills | Status: DC

## 2016-04-26 MED ORDER — DIVALPROEX SODIUM 250 MG PO DR TAB: DELAYED_RELEASE_TABLET | ORAL | 2 refills | Status: DC

## 2016-05-03 ENCOUNTER — Ambulatory Visit (INDEPENDENT_AMBULATORY_CARE_PROVIDER_SITE_OTHER): Payer: Self-pay | Admitting: Internal Medicine

## 2016-05-03 ENCOUNTER — Other Ambulatory Visit: Payer: Self-pay | Admitting: Internal Medicine

## 2016-05-03 VITALS — BP 118/60 | HR 82 | Temp 98.2°F | Resp 16 | Ht 67.5 in | Wt 160.0 lb

## 2016-05-03 DIAGNOSIS — F909 Attention-deficit hyperactivity disorder, unspecified type: Secondary | ICD-10-CM

## 2016-05-03 DIAGNOSIS — F4323 Adjustment disorder with mixed anxiety and depressed mood: Secondary | ICD-10-CM

## 2016-05-03 DIAGNOSIS — F988 Other specified behavioral and emotional disorders with onset usually occurring in childhood and adolescence: Secondary | ICD-10-CM

## 2016-05-03 DIAGNOSIS — F411 Generalized anxiety disorder: Secondary | ICD-10-CM

## 2016-05-03 MED ORDER — AMPHETAMINE-DEXTROAMPHETAMINE 30 MG PO TABS: ORAL_TABLET | ORAL | 0 refills | Status: DC

## 2016-05-03 MED ORDER — QUETIAPINE FUMARATE 50 MG PO TABS: 50.0000 mg | ORAL_TABLET | Freq: Two times a day (BID) | ORAL | 0 refills | Status: DC

## 2016-05-03 MED ORDER — BUSPIRONE HCL 10 MG PO TABS: 10.0000 mg | ORAL_TABLET | Freq: Two times a day (BID) | ORAL | 0 refills | Status: DC

## 2016-05-03 NOTE — Progress Notes (Signed)
   Subjective:    Patient ID: William Willis, male    DOB: 1981-05-04, 35 y.o.   MRN: [id number]  HPI  Patient presents to the office for evaluation of medication changes.  Patient has a history of severe depression with some manic phases.  He had recently called the office as he was not doing well on lamictal.  He was started on depakote and was told by his mother that he couldn't take it.  She reports that he cannot remember to take the medications more than 3 times per day.  He reports that he has been having some more spasms of his legs and has had some jittery feelings.  His mother reports that when he was a child he was on depakote and had very intense feelings of suicidality.    Review of Systems  Constitutional: Negative for chills, fatigue and fever.  Respiratory: Negative for chest tightness and shortness of breath.   Psychiatric/Behavioral: Positive for decreased concentration and dysphoric mood. Negative for agitation, behavioral problems and suicidal ideas. The patient is not nervous/anxious and is not hyperactive.        Objective:   Physical Exam  Constitutional: He is oriented to person, place, and time. He appears well-developed and well-nourished. No distress.  HENT:  Head: Normocephalic.  Mouth/Throat: Oropharynx is clear and moist. No oropharyngeal exudate.  Eyes: Conjunctivae are normal. No scleral icterus.  Neck: Normal range of motion. Neck supple. No JVD present. No thyromegaly present.  Cardiovascular: Normal rate, regular rhythm, normal heart sounds and intact distal pulses.  Exam reveals no gallop and no friction rub.   No murmur heard. Pulmonary/Chest: Effort normal and breath sounds normal. No respiratory distress. He has no wheezes. He has no rales. He exhibits no tenderness.  Abdominal: Soft. Bowel sounds are normal. He exhibits no distension and no mass. There is no tenderness. There is no rebound and no guarding.  Musculoskeletal: Normal range of motion.   Lymphadenopathy:    He has no cervical adenopathy.  Neurological: He is alert and oriented to person, place, and time. No cranial nerve deficit. Coordination normal.  Skin: Skin is warm and dry. No rash noted. He is not diaphoretic.  Psychiatric: He has a normal mood and affect. His behavior is normal. Judgment and thought content normal.  Nursing note and vitals reviewed.   Vitals:   05/03/16 1633  BP: 118/60  Pulse: 82  Resp: 16  Temp: 98.2 F (36.8 C)         Assessment & Plan:    1. Anxiety state -cont buspar - busPIRone (BUSPAR) 10 MG tablet; Take 1 tablet (10 mg total) by mouth 2 (two) times daily.  Dispense: 270 tablet; Refill: 0  2. ADD (attention deficit disorder)  - amphetamine-dextroamphetamine (ADDERALL) 30 MG tablet; Take 1/2 to 1 tablet twice daily by mouth. This should not be taken daily.  Dispense: 45 tablet; Refill: 0  3.  Mood disorder -change to seroquel 50 mg BID.  Will recheck in 2-3 weeks and consider increasing the dose -tegretol was very expensive so changed to seroquel for cost.  Patient did experience some akathesia with the tegretol.  If this occurs on seroquel we can consider using cogentin with it.  -will like need an increase in medication in 2 weeks.

## 2016-05-03 NOTE — Patient Instructions (Signed)
Quetiapine tablets What is this medicine? QUETIAPINE (kwe TYE a peen) is an antipsychotic. It is used to treat schizophrenia and bipolar disorder, also known as manic-depression. This medicine may be used for other purposes; ask your health care provider or pharmacist if you have questions. What should I tell my health care provider before I take this medicine? They need to know if you have any of these conditions: -brain tumor or head injury -breast cancer -cataracts -diabetes -difficulty swallowing -heart disease -kidney disease -liver disease -low blood counts, like low white cell, platelet, or red cell counts -low blood pressure or dizziness when standing up -Parkinson's disease -previous heart attack -seizures -suicidal thoughts, plans, or attempt by you or a family member -thyroid disease -an unusual or allergic reaction to quetiapine, other medicines, foods, dyes, or preservatives -pregnant or trying to get pregnant -breast-feeding How should I use this medicine? Take this medicine by mouth. Swallow it with a drink of water. Follow the directions on the prescription label. If it upsets your stomach you can take it with food. Take your medicine at regular intervals. Do not take it more often than directed. Do not stop taking except on the advice of your doctor or health care professional. A special MedGuide will be given to you by the pharmacist with each prescription and refill. Be sure to read this information carefully each time. Talk to your pediatrician regarding the use of this medicine in children. While this drug may be prescribed for children as young as 10 years for selected conditions, precautions do apply. Patients over age 65 years may have a stronger reaction to this medicine and need smaller doses. Overdosage: If you think you have taken too much of this medicine contact a poison control center or emergency room at once. NOTE: This medicine is only for you. Do not  share this medicine with others. What if I miss a dose? If you miss a dose, take it as soon as you can. If it is almost time for your next dose, take only that dose. Do not take double or extra doses. What may interact with this medicine? Do not take this medicine with any of the following medications: -certain medicines for fungal infections like fluconazole, itraconazole, ketoconazole, posaconazole, voriconazole -cisapride -dofetilide -dronedarone -droperidol -grepafloxacin -halofantrine -phenothiazines like chlorpromazine, mesoridazine, thioridazine -pimozide -sparfloxacin -ziprasidone This medicine may also interact with the following medications: -alcohol -antiviral medicines for HIV or AIDS -certain medicines for blood pressure -certain medicines for depression, anxiety, or psychotic disturbances like haloperidol, lorazepam -certain medicines for diabetes -certain medicines for Parkinson's disease -certain medicines for seizures like carbamazepine, phenobarbital, phenytoin -cimetidine -erythromycin -other medicines that prolong the QT interval (cause an abnormal heart rhythm) -rifampin -steroid medicines like prednisone or cortisone This list may not describe all possible interactions. Give your health care provider a list of all the medicines, herbs, non-prescription drugs, or dietary supplements you use. Also tell them if you smoke, drink alcohol, or use illegal drugs. Some items may interact with your medicine. What should I watch for while using this medicine? Visit your doctor or health care professional for regular checks on your progress. It may be several weeks before you see the full effects of this medicine. Your health care provider may suggest that you have your eyes examined prior to starting this medicine, and every 6 months thereafter. If you have been taking this medicine regularly for some time, do not suddenly stop taking it. You must gradually reduce the dose  or   your symptoms may get worse. Ask your doctor or health care professional for advice. Patients and their families should watch out for worsening depression or thoughts of suicide. Also watch out for sudden or severe changes in feelings such as feeling anxious, agitated, panicky, irritable, hostile, aggressive, impulsive, severely restless, overly excited and hyperactive, or not being able to sleep. If this happens, especially at the beginning of antidepressant treatment or after a change in dose, call your health care professional. You may get dizzy or drowsy. Do not drive, use machinery, or do anything that needs mental alertness until you know how this medicine affects you. Do not stand or sit up quickly, especially if you are an older patient. This reduces the risk of dizzy or fainting spells. Alcohol can increase dizziness and drowsiness. Avoid alcoholic drinks. Do not treat yourself for colds, diarrhea or allergies. Ask your doctor or health care professional for advice, some ingredients may increase possible side effects. This medicine can reduce the response of your body to heat or cold. Dress warm in cold weather and stay hydrated in hot weather. If possible, avoid extreme temperatures like saunas, hot tubs, very hot or cold showers, or activities that can cause dehydration such as vigorous exercise. What side effects may I notice from receiving this medicine? Side effects that you should report to your doctor or health care professional as soon as possible: -allergic reactions like skin rash, itching or hives, swelling of the face, lips, or tongue -difficulty swallowing -fast or irregular heartbeat -fever or chills, sore throat -fever with rash, swollen lymph nodes, or swelling of the face -increased hunger or thirst -increased urination -problems with balance, talking, walking -seizures -stiff muscles -suicidal thoughts or other mood changes -uncontrollable head, mouth, neck, arm, or  leg movements -unusually weak or tired Side effects that usually do not require medical attention (report to your doctor or health care professional if they continue or are bothersome): -change in sex drive or performance -constipation -drowsy or dizzy -dry mouth -stomach upset -weight gain This list may not describe all possible side effects. Call your doctor for medical advice about side effects. You may report side effects to FDA at 1-800-FDA-1088. Where should I keep my medicine? Keep out of the reach of children. Store at room temperature between 15 and 30 degrees C (59 and 86 degrees F). Throw away any unused medicine after the expiration date. NOTE: This sheet is a summary. It may not cover all possible information. If you have questions about this medicine, talk to your doctor, pharmacist, or health care provider.    2016, [employer]vier/[employer] Standard. (2015-02-10 13:07:35)  

## 2016-05-16 ENCOUNTER — Ambulatory Visit: Payer: Self-pay | Admitting: Internal Medicine

## 2016-05-23 ENCOUNTER — Ambulatory Visit: Payer: Self-pay | Admitting: Physician Assistant

## 2016-06-22 ENCOUNTER — Ambulatory Visit: Payer: Self-pay | Admitting: Internal Medicine

## 2016-06-22 ENCOUNTER — Encounter: Payer: Self-pay | Admitting: Internal Medicine

## 2016-06-22 VITALS — BP 116/60 | HR 96 | Temp 98.0°F | Resp 16 | Ht 67.5 in | Wt 165.0 lb

## 2016-06-22 DIAGNOSIS — F988 Other specified behavioral and emotional disorders with onset usually occurring in childhood and adolescence: Secondary | ICD-10-CM

## 2016-06-22 DIAGNOSIS — F341 Dysthymic disorder: Secondary | ICD-10-CM

## 2016-06-22 MED ORDER — AMPHETAMINE-DEXTROAMPHETAMINE 30 MG PO TABS: ORAL_TABLET | ORAL | 0 refills | Status: DC

## 2016-06-22 NOTE — Patient Instructions (Signed)
Please contact [facility] and make an appointment.  Please also go to the [facility] and see if you can get set up with the [facility] to help afford some of your medications.     [facility]  [address], [place], [place]  [phone]  [facility]  [address], [place], [place]  [phone]

## 2016-06-22 NOTE — Progress Notes (Signed)
Assessment and Plan:   1. Attention deficit disorder (ADD) without hyperactivity -patient notified that this will be his last precription as he likely does not need to mix adderall with marijuana. -will defer to psychiatry for further medications regarding add and dysthmia vs. Major depressive disorder - amphetamine-dextroamphetamine (ADDERALL) 30 MG tablet; Take 1/2 to 1 tablet twice daily by mouth. This should not be taken daily.  Dispense: 45 tablet; Refill: 0  2. Dysthymia -patient only too seroquel once. -discussed going back on tegretrol which he reports that EPS symptoms with.  He has been non-compliant and financial concerns are limiting prescribing of psych medications. At this time [facility] for further evaluation and likely financial help with medications.  Have also recommended that the patient get [facility] to help afford medications, lab work and also further care.      HPI 35 y.o.male presents for 1 month follow up of dysthymia. Patient reports that currently he is only taking.  male is not taking their medication.  He reports that he could not function on the seroquel.  He reports that he is just doing the buspar and the adderall.  He reports that he felt like the medication was too sedating.  He felt like it made him feel like he took 15 xanax.  He reports that he did take some valium last week.  Of note he does not have a prescription for valium.  He also admits to smoking marijuana regularly.  [facility] not gone [facility].  He has not seen a psychiatrist.    Past Medical History:  Diagnosis Date  . ADD (attention deficit disorder)   . Anxiety      No Known Allergies    Current Outpatient Prescriptions on File Prior to Visit  Medication Sig Dispense Refill  . amphetamine-dextroamphetamine (ADDERALL) 30 MG tablet Take 1/2 to 1 tablet twice daily by mouth.  This should not be taken daily. 45 tablet 0   No current facility-administered medications on file prior to visit.     ROS: all negative except above.   Physical Exam: Filed Weights   06/22/16 1603  Weight: 165 lb (74.8 kg)   BP 116/60   Pulse 96   Temp 98 F (36.7 C) (Temporal)   Resp 16   Ht 5' 7.5" (1.715 m)   Wt 165 lb (74.8 kg)   BMI 25.46 kg/m  General Appearance: Well developed well nourished, non-toxic appearing in no apparent distress. Eyes: PERRLA, EOMs, conjunctiva w/ no swelling or erythema or discharge Sinuses: No Frontal/maxillary tenderness ENT/Mouth: Ear canals clear without swelling or erythema.  TM's normal bilaterally with no retractions, bulging, or loss of landmarks.   Neck: Supple, thyroid normal, no notable JVD  Respiratory: Respiratory effort normal, Clear breath sounds anteriorly and posteriorly bilaterally without rales, rhonchi, wheezing or stridor. No retractions or accessory muscle usage. Cardio: RRR with no MRGs.   Abdomen: Soft, + BS.  Non tender, no guarding, rebound, hernias, masses.  Musculoskeletal: Full ROM, 5/5 strength, normal gait.  Skin: Warm, dry without rashes  Neuro: Awake and oriented X 3, Cranial nerves intact. Normal muscle tone, no cerebellar symptoms. Sensation intact.  Psych: normal affect, Insight and Judgment appropriate.     Terri Piedraourtney Forcucci, PA-C 4:13 PM [facility]

## 2016-06-23 ENCOUNTER — Other Ambulatory Visit: Payer: Self-pay | Admitting: Internal Medicine

## 2016-08-04 ENCOUNTER — Other Ambulatory Visit: Payer: Self-pay | Admitting: Physician Assistant

## 2016-08-04 DIAGNOSIS — F988 Other specified behavioral and emotional disorders with onset usually occurring in childhood and adolescence: Secondary | ICD-10-CM

## 2016-08-04 MED ORDER — AMPHETAMINE-DEXTROAMPHETAMINE 30 MG PO TABS: ORAL_TABLET | ORAL | 0 refills | Status: DC

## 2016-09-12 ENCOUNTER — Other Ambulatory Visit: Payer: Self-pay | Admitting: Physician Assistant

## 2016-09-12 DIAGNOSIS — F988 Other specified behavioral and emotional disorders with onset usually occurring in childhood and adolescence: Secondary | ICD-10-CM

## 2016-09-12 MED ORDER — AMPHETAMINE-DEXTROAMPHETAMINE 30 MG PO TABS: ORAL_TABLET | ORAL | 0 refills | Status: DC

## 2016-09-18 ENCOUNTER — Other Ambulatory Visit: Payer: Self-pay | Admitting: Internal Medicine

## 2016-09-18 DIAGNOSIS — F411 Generalized anxiety disorder: Secondary | ICD-10-CM

## 2016-11-08 ENCOUNTER — Ambulatory Visit (INDEPENDENT_AMBULATORY_CARE_PROVIDER_SITE_OTHER): Payer: Self-pay | Admitting: Physician Assistant

## 2016-11-08 ENCOUNTER — Encounter: Payer: Self-pay | Admitting: Physician Assistant

## 2016-11-08 VITALS — BP 126/64 | HR 68 | Temp 98.6°F | Resp 14 | Ht 67.0 in | Wt 170.0 lb

## 2016-11-08 DIAGNOSIS — F988 Other specified behavioral and emotional disorders with onset usually occurring in childhood and adolescence: Secondary | ICD-10-CM

## 2016-11-08 DIAGNOSIS — J40 Bronchitis, not specified as acute or chronic: Secondary | ICD-10-CM

## 2016-11-08 MED ORDER — AZITHROMYCIN 250 MG PO TABS: ORAL_TABLET | ORAL | 1 refills | Status: AC

## 2016-11-08 MED ORDER — PREDNISONE 20 MG PO TABS: ORAL_TABLET | ORAL | 0 refills | Status: DC

## 2016-11-08 MED ORDER — AMPHETAMINE-DEXTROAMPHETAMINE 30 MG PO TABS: ORAL_TABLET | ORAL | 0 refills | Status: DC

## 2016-11-08 MED ORDER — PROMETHAZINE-DM 6.25-15 MG/5ML PO SYRP: 5.0000 mL | ORAL_SOLUTION | Freq: Four times a day (QID) | ORAL | 1 refills | Status: DC | PRN

## 2016-11-08 NOTE — Patient Instructions (Signed)
Make sure you are on an allergy pill, see below for more details. Please take the prednisone as directed below, this is NOT an antibiotic so you do NOT have to finish it. You can take it for a few days and stop it if you are doing better.   Please take the prednisone to help decrease inflammation and therefore decrease symptoms. Take it it with food to avoid GI upset. It can cause increased energy but on the other hand it can make it hard to sleep at night so please take it AT NIGHT WITH DINNER, it takes 8-12 hours to start working so it will NOT affect your sleeping if you take it at night with your food!!  If you are diabetic it will increase your sugars so decrease carbs and monitor your sugars closely.     Go to the ER if any CP, SOB, nausea, dizziness, severe HA, changes vision/speech  HOW TO TREAT VIRAL COUGH AND COLD SYMPTOMS:  -Symptoms usually last at least 1 week with the worst symptoms being around day 4.  - colds usually start with a sore throat and end with a cough, and the cough can take 2 weeks to get better.  -No antibiotics are needed for colds, flu, sore throats, cough, bronchitis UNLESS symptoms are longer than 7 days OR if you are getting better then get drastically worse.  -There are a lot of combination medications (Dayquil, Nyquil, Vicks 44, tyelnol cold and sinus, ETC). Please look at the ingredients on the back so that you are treating the correct symptoms and not doubling up on medications/ingredients.    Medicines you can use  Nasal congestion  - pseudoephedrine (Sudafed)- behind the counter, do not use if you have high blood pressure, medicine that have -D in them.  - phenylephrine (Sudafed PE) -Dextormethorphan + chlorpheniramine (Coridcidin HBP)- okay if you have high blood pressure -Oxymetazoline (Afrin) nasal spray- LIMIT to 3 days -Saline nasal spray -Neti pot (used distilled or bottled water)  Ear pain/congestion  -pseudoephedrine (sudafed) -  Nasonex/flonase nasal spray  Fever  -Acetaminophen (Tyelnol) -Ibuprofen (Advil, motrin, aleve)  Sore Throat  -Acetaminophen (Tyelnol) -Ibuprofen (Advil, motrin, aleve) -Drink a lot of water -Gargle with salt water - Rest your voice (don't talk) -Throat sprays -Cough drops  Body Aches  -Acetaminophen (Tyelnol) -Ibuprofen (Advil, motrin, aleve)  Headache  -Acetaminophen (Tyelnol) -Ibuprofen (Advil, motrin, aleve) - Exedrin, Exedrin Migraine  Allergy symptoms (cough, sneeze, runny nose, itchy eyes) -Claritin or loratadine cheapest but likely the weakest  -Zyrtec or certizine at night because it can make you sleepy -The strongest is allegra or fexafinadine  Cheapest at [employer], [employer], [employer]  Cough  -Dextromethorphan (Delsym)- medicine that has DM in it -Guafenesin (Mucinex/Robitussin) - cough drops - drink lots of water  Chest Congestion  -Guafenesin (Mucinex/Robitussin)  Red Itchy Eyes  - Naphcon-A  Upset Stomach  - Bland diet (nothing spicy, greasy, fried, and high acid foods like tomatoes, oranges, berries) -OKAY- cereal, bread, soup, crackers, rice -Eat smaller more frequent meals -reduce caffeine, no alcohol -Loperamide (Imodium-AD) if diarrhea -Prevacid for heart burn  General health when sick  -Hydration -wash your hands frequently -keep surfaces clean -change pillow cases and sheets often -Get fresh air but do not exercise strenuously -Vitamin D, double up on it - Vitamin C -Zinc

## 2016-11-08 NOTE — Progress Notes (Signed)
Subjective:    Patient ID: William Willis, male    DOB: [birth year], 36 y.o.   MRN: [id number]  HPI 36 y.o.smoking WM presents with cold symptoms x 1 week.  Started with fever, chills, and sinus pain/eye itching, took OTC flu that helped, got progressively worse with fatigue, has cough with productive mucus, wheezing and shortness of breath.   Blood pressure 126/64, pulse 68, temperature 98.6 F (37 C), temperature source Temporal, resp. rate 14, height 5\' 7"  (1.702 m), weight 170 lb (77.1 kg), SpO2 92 %.  Medications Current Outpatient Prescriptions on File Prior to Visit  Medication Sig  . amphetamine-dextroamphetamine (ADDERALL) 30 MG tablet Take 1/2 to 1 tablet twice daily by mouth. This should not be taken daily.  . busPIRone (BUSPAR) 10 MG tablet Take 10 mg by mouth 3 (three) times daily.   No current facility-administered medications on file prior to visit.     Problem list He has ADD (attention deficit disorder); Vitamin D Deficiency; Anxiety state; and Dysthymia on his problem list.  Review of Systems  Constitutional: Positive for chills, fatigue and fever. Negative for diaphoresis.  HENT: Positive for congestion, postnasal drip, rhinorrhea, sinus pressure and sore throat. Negative for ear pain, sneezing, trouble swallowing and voice change.   Eyes: Negative.   Respiratory: Positive for cough, shortness of breath and wheezing. Negative for chest tightness.   Cardiovascular: Negative.   Gastrointestinal: Negative.   Genitourinary: Negative.   Musculoskeletal: Negative.  Negative for neck pain.  Neurological: Negative.  Negative for headaches.       Objective:   Physical Exam  Constitutional: He is oriented to person, place, and time. He appears well-developed and well-nourished.  HENT:  Head: Normocephalic and atraumatic.  Right Ear: External ear normal.  Left Ear: External ear normal.  Nose: Nose normal.  Mouth/Throat: Oropharynx is clear and moist.  Eyes:  Conjunctivae are normal. Pupils are equal, round, and reactive to light.  Neck: Normal range of motion. Neck supple.  Cardiovascular: Normal rate, regular rhythm and normal heart sounds.   No murmur heard. Pulmonary/Chest: Effort normal. No respiratory distress. He has wheezes. He has no rales. He exhibits no tenderness.  Abdominal: Soft. Bowel sounds are normal.  Lymphadenopathy:    He has no cervical adenopathy.  Neurological: He is alert and oriented to person, place, and time.  Skin: Skin is warm and dry.      Assessment & Plan:  1. Bronchitis - predniSONE (DELTASONE) 20 MG tablet; 2 tablets daily for 3 days, 1 tablet daily for 4 days.  Dispense: 10 tablet; Refill: 0 - promethazine-dextromethorphan (PROMETHAZINE-DM) 6.25-15 MG/5ML syrup; Take 5 mLs by mouth 4 (four) times daily as needed for cough.  Dispense: 240 mL; Refill: 1 - azithromycin (ZITHROMAX) 250 MG tablet; Take 2 tablets (500 mg) on  Day 1,  followed by 1 tablet (250 mg) once daily on Days 2 through 5.  Dispense: 6 each; Refill: 1  2. Attention deficit disorder (ADD) without hyperactivity - amphetamine-dextroamphetamine (ADDERALL) 30 MG tablet; Take 1/2 to 1 tablet twice daily by mouth. This should not be taken daily.  Dispense: 45 tablet; Refill: 0   The patient was advised to call immediately if he has any concerning symptoms in the interval. The patient voices understanding of current treatment options and is in agreement with the current care plan.The patient knows to call the clinic with any problems, questions or concerns or go to the ER if any further progression of symptoms.  Schedule CPE

## 2016-11-10 ENCOUNTER — Telehealth: Payer: Self-pay

## 2016-11-10 ENCOUNTER — Emergency Department (HOSPITAL_COMMUNITY)
Admission: EM | Admit: 2016-11-10 | Discharge: 2016-11-10 | Disposition: A | Discharge status: Home / Self Care | Payer: Self-pay | Attending: Emergency Medicine | Admitting: Emergency Medicine

## 2016-11-10 ENCOUNTER — Emergency Department (HOSPITAL_COMMUNITY): Payer: Self-pay

## 2016-11-10 ENCOUNTER — Encounter (HOSPITAL_COMMUNITY): Payer: Self-pay | Admitting: Emergency Medicine

## 2016-11-10 DIAGNOSIS — J208 Acute bronchitis due to other specified organisms: Secondary | ICD-10-CM | POA: Insufficient documentation

## 2016-11-10 DIAGNOSIS — F17210 Nicotine dependence, cigarettes, uncomplicated: Secondary | ICD-10-CM | POA: Insufficient documentation

## 2016-11-10 DIAGNOSIS — F1290 Cannabis use, unspecified, uncomplicated: Secondary | ICD-10-CM | POA: Insufficient documentation

## 2016-11-10 DIAGNOSIS — Z79899 Other long term (current) drug therapy: Secondary | ICD-10-CM | POA: Insufficient documentation

## 2016-11-10 HISTORY — DX: Pneumonia, unspecified organism: J18.9

## 2016-11-10 NOTE — Discharge Instructions (Signed)
Your chest x-ray suggest acute bronchitis. No pneumonia appreciated. Your vital signs within normal limits. Your oxygen level is 98% on room air. Please continue your prednisone, Z-Pak, and your promethazine DM for cough. Please increase water, Gatorade, juices, etc. Please wash hands frequently. Please use 600 mg of ibuprofen with breakfast, lunch, dinner, and at bedtime to help with chest wall soreness. Use the decongestant of your choice for the nasal congestion. Please see Dr.McKeown for follow-up and recheck if not improving.

## 2016-11-10 NOTE — ED Provider Notes (Signed)
[facility] Provider Note   CSN: [id number] Arrival date & time: 11/10/16  1558     History   Chief Complaint Chief Complaint  Patient presents with  . Cough    HPI William Willis is a 36 y.o. male.  Patient is a 36 year old male who presents to the emergency department with a complaint of chest soreness and productive cough.  The patient states that he's been sick for one and a half weeks on. He states that he has on nasal congestion, scratchiness in his throat, and productive cough with yellowish phlegm. He's been seen by his primary physician, he's been treated with prednisone, Z-Pak, and promethazine DM. The patient states that he just does not feel as though he is getting any better. He presents to the emergency department for additional evaluation and management. The patient is also concerned because he's had pneumonia in the past and he wants to be checked for additional problems.      Past Medical History:  Diagnosis Date  . ADD (attention deficit disorder)   . Anxiety   . Pneumonia     Patient Active Problem List   Diagnosis Date Noted  . Dysthymia 09/15/2015  . Anxiety state 12/04/2014  . Vitamin D Deficiency 05/07/2014  . ADD (attention deficit disorder) 07/24/2013    Past Surgical History:  Procedure Laterality Date  . ADENOIDECTOMY    . SHOULDER SURGERY Right 12-08-11  . TONSILLECTOMY         Home Medications    Prior to Admission medications   Medication Sig Start Date End Date Taking? Authorizing Provider  amphetamine-dextroamphetamine (ADDERALL) 30 MG tablet Take 1/2 to 1 tablet twice daily by mouth. This should not be taken daily. 11/08/16 01/04/17  Quentin MullingAmanda Collier, PA-C  azithromycin (ZITHROMAX) 250 MG tablet Take 2 tablets (500 mg) on  Day 1,  followed by 1 tablet (250 mg) once daily on Days 2 through 5. 11/08/16 11/13/16  Quentin MullingAmanda Collier, PA-C  busPIRone (BUSPAR) 10 MG tablet Take 10 mg by mouth 3 (three) times daily.    Historical  Provider, MD  carbamazepine (TEGRETOL) 200 MG tablet Take 200 mg by mouth 2 (two) times daily.    Historical Provider, MD  predniSONE (DELTASONE) 20 MG tablet 2 tablets daily for 3 days, 1 tablet daily for 4 days. 11/08/16   Quentin MullingAmanda Collier, PA-C  promethazine-dextromethorphan (PROMETHAZINE-DM) 6.25-15 MG/5ML syrup Take 5 mLs by mouth 4 (four) times daily as needed for cough. 11/08/16   Quentin MullingAmanda Collier, PA-C    Family History History reviewed. No pertinent family history.  Social History Social History  Substance Use Topics  . Smoking status: Current Every Day Smoker    Packs/day: 2.00    Years: 15.00    Types: Cigarettes  . Smokeless tobacco: Never Used  . Alcohol use Yes     Comment: rare     Allergies   Patient has no known allergies.   Review of Systems Review of Systems  HENT: Positive for congestion and postnasal drip.   Respiratory: Positive for cough.        Chest wall tenderness  Musculoskeletal: Positive for myalgias.  All other systems reviewed and are negative.    Physical Exam Updated Vital Signs BP 140/81 (BP Location: Left Arm)   Pulse 94   Temp 98.2 F (36.8 C) (Oral)   Resp 20   Ht 5\' 6"  (1.676 m)   Wt 78.5 kg   SpO2 98%   BMI 27.92 kg/m   Physical  Exam  Constitutional: He is oriented to person, place, and time. He appears well-developed and well-nourished.  Non-toxic appearance.  HENT:  Head: Normocephalic.  Right Ear: Tympanic membrane and external ear normal.  Left Ear: Tympanic membrane and external ear normal.  Nasal congestion present.  Minimal redness of the posterior pharynx. The airway is patent.  Eyes: EOM and lids are normal. Pupils are equal, round, and reactive to light.  Neck: Normal range of motion. Neck supple. Carotid bruit is not present.  Cardiovascular: Normal rate, regular rhythm, normal heart sounds, intact distal pulses and normal pulses.   Pulmonary/Chest: Breath sounds normal. No respiratory distress.  There is  symmetrical rise and fall of the chest. The patient speaks in complete sentences. Coarse breath sounds present few scattered rhonchi present.  Abdominal: Soft. Bowel sounds are normal. There is no tenderness. There is no guarding.  Musculoskeletal: Normal range of motion. He exhibits no edema.  Negative Homans signs  Lymphadenopathy:       Head (right side): No submandibular adenopathy present.       Head (left side): No submandibular adenopathy present.    He has no cervical adenopathy.  Neurological: He is alert and oriented to person, place, and time. He has normal strength. No cranial nerve deficit or sensory deficit.  Skin: Skin is warm and dry. No rash noted.  Psychiatric: He has a normal mood and affect. His speech is normal.  Nursing note and vitals reviewed.    ED Treatments / Results  Labs (all labs ordered are listed, but only abnormal results are displayed) Labs Reviewed  CBC WITH DIFFERENTIAL/PLATELET  COMPREHENSIVE METABOLIC PANEL    EKG  EKG Interpretation None       Radiology Dg Chest 2 View  Result Date: 11/10/2016 CLINICAL DATA:  36 year old male with shortness of breath, chest pain and productive cough EXAM: CHEST  2 VIEW COMPARISON:  Prior chest x-ray 02/08/2015 FINDINGS: No focal airspace consolidation. However, there is slightly increased interstitial prominence and central bronchial wall thickening. The cardiac and mediastinal contours are normal. No pleural effusion or pneumothorax. No acute osseous abnormality. IMPRESSION: 1. Slightly increased interstitial prominence and central bronchial wall thickening. Similar findings can be seen in the setting of bronchitis and viral or atypical respiratory infection. 2. No focal airspace consolidation to suggest bacterial pneumonia. Electronically Signed   By: Malachy Moan M.D.   On: 11/10/2016 16:57    Procedures Procedures (including critical care time)  Medications Ordered in ED Medications - No data to  display   Initial Impression / Assessment and Plan / ED Course  I have reviewed the triage vital signs and the nursing notes.  Pertinent labs & imaging results that were available during my care of the patient were reviewed by me and considered in my medical decision making (see chart for details).     *I have reviewed nursing notes, vital signs, and all appropriate lab and imaging results for this patient.  Final Clinical Impressions(s) / ED Diagnoses  MDM Vital signs within normal limits. Pulse oximetry is 98% on room air. Patient speaks in complete sentences without problem. Chest x-ray shows slight increase in on central bronchial wall thickening, consistent with bronchitis, or atypical respiratory infection. There is no focal airspace consolidation.  I discussed the findings of the examination is well as the x-ray with the patient and family. I discussed the patient's current medications with the patient and family. I discussed with them that the need for increased fluids should be  considered. I also discussed with him the importance of using ibuprofen 3 or 4 times daily over the next 3 days to obtain a proper blood level. They will continue the prednisone, there is a pack, and the promethazine DM. I've asked him to see their primary physician for additional reevaluation if not improving.    Final diagnoses:  None    New Prescriptions New Prescriptions   No medications on file     Ivery Quale, PA-C 11/10/16 1907    Vanetta Mulders, MD 11/11/16 [phone]

## 2016-11-10 NOTE — Telephone Encounter (Signed)
Pt called stating that he is still having chest pain & extremely fatigued. States he does not feel like he has improved since his last office visit.   Per provider pt should go to ER may need IV ABX. Pt's wife was informed of these instructions & voiced understanding & hung up.

## 2016-11-10 NOTE — ED Triage Notes (Signed)
Patient c/o productive c/o with thick greenish yellow sputum x1.5 weeks. Patient seen at PCP on Tuesday and diagnosed with pneumonia. Patient given oral antibiotics-per patient no improvement. Denies any fevers, reports chills. Patient states chest hurts with deep breath.

## 2016-12-26 ENCOUNTER — Other Ambulatory Visit: Payer: Self-pay | Admitting: Physician Assistant

## 2016-12-26 DIAGNOSIS — F988 Other specified behavioral and emotional disorders with onset usually occurring in childhood and adolescence: Secondary | ICD-10-CM

## 2016-12-26 MED ORDER — AMPHETAMINE-DEXTROAMPHETAMINE 30 MG PO TABS: ORAL_TABLET | ORAL | 0 refills | Status: DC
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
## 2017-02-13 ENCOUNTER — Other Ambulatory Visit: Payer: Self-pay | Admitting: Physician Assistant

## 2017-02-13 DIAGNOSIS — F988 Other specified behavioral and emotional disorders with onset usually occurring in childhood and adolescence: Secondary | ICD-10-CM

## 2017-02-13 MED ORDER — AMPHETAMINE-DEXTROAMPHETAMINE 30 MG PO TABS: ORAL_TABLET | ORAL | 0 refills | Status: DC

## 2017-02-15 ENCOUNTER — Ambulatory Visit: Payer: Self-pay | Admitting: Internal Medicine

## 2017-04-06 ENCOUNTER — Ambulatory Visit (INDEPENDENT_AMBULATORY_CARE_PROVIDER_SITE_OTHER): Payer: BLUE CROSS/BLUE SHIELD | Admitting: Internal Medicine

## 2017-04-06 DIAGNOSIS — F988 Other specified behavioral and emotional disorders with onset usually occurring in childhood and adolescence: Secondary | ICD-10-CM | POA: Diagnosis not present

## 2017-04-06 MED ORDER — BUPROPION HCL ER (XL) 300 MG PO TB24: ORAL_TABLET | ORAL | 1 refills | Status: DC

## 2017-04-06 MED ORDER — AMPHETAMINE-DEXTROAMPHETAMINE 30 MG PO TABS: ORAL_TABLET | ORAL | 0 refills | Status: DC

## 2017-04-06 NOTE — Progress Notes (Signed)
  Subjective:    Patient ID: William Willis, male    DOB: 06-07-1981, 36 y.o.   MRN: [id number]  HPI  This nice 36 yo  MWM with Chronic Anxiety and Dysthymia and ADD presents fro recheck and med review. Patient still endorses difficulty focusing and concentrating. He feel mood is stable on Tegretol and Buspar. He endorses continued benefit taking Adderall in performing his job. Medication Sig  . busPIRone (BUSPAR) 10 MG tablet Take 10 mg by mouth 3 (three) times daily.  . carbamazepine (TEGRETOL) 200 MG tablet Take 200 mg by mouth 2 (two) times daily.  Marland Kitchen. amphetamine-dextroamphetamine (ADDERALL) 30 MG tablet Take 1/2 to 1 tablet twice daily by mouth. This should not be taken daily.   No Known Allergies   Past Medical History:  Diagnosis Date  . ADD (attention deficit disorder)   . Anxiety   . Pneumonia    Past Surgical History:  Procedure Laterality Date  . ADENOIDECTOMY    . SHOULDER SURGERY Right 12-08-11  . TONSILLECTOMY     Review of Systems  10 point systems review negative except as above.    Objective:   Physical Exam  BP 106/66   Pulse 64   Temp 97.8 F (36.6 C)   Resp 18   Ht 5' 7.5" (1.715 m)   Wt 156 lb 9.6 oz (71 kg)   BMI 24.17 kg/m   HEENT - WNL. Neck - supple.  Chest - Clear equal BS. Cor - Nl HS. RRR w/o sig MGR. PP 1(+). No edema. MS- FROM w/o deformities.  Gait Nl. Neuro -  Nl w/o focal abnormalities.    Assessment & Plan:   1. Attention deficit disorder (ADD) without hyperactivity  - amphetamine-dextroamphetamine (ADDERALL) 30 MG tablet; Take 1 to 2 tablets / day for ADD - max 5 days or 10 tabs/week - should last 9 weeks til Oct 18 or later  Dispense: 45 tablet; Refill: 0  - Start Bupropion for his Depression and ADD and advised ROV 6 weeks  - buPROPion (WELLBUTRIN XL) 300 MG 24 hr tablet; Take 1 tablet daily for ADD  Dispense: 90 tablet; Refill: 1  - Advised to avoid street drugs, Marijuana, etc with his prescription meds.

## 2017-04-07 ENCOUNTER — Encounter: Payer: Self-pay | Admitting: Internal Medicine

## 2017-06-19 ENCOUNTER — Ambulatory Visit (INDEPENDENT_AMBULATORY_CARE_PROVIDER_SITE_OTHER): Payer: BLUE CROSS/BLUE SHIELD | Admitting: Internal Medicine

## 2017-06-19 ENCOUNTER — Encounter: Payer: Self-pay | Admitting: Internal Medicine

## 2017-06-19 VITALS — BP 122/76 | HR 68 | Temp 97.5°F | Resp 18 | Ht 67.5 in | Wt 164.6 lb

## 2017-06-19 DIAGNOSIS — F411 Generalized anxiety disorder: Secondary | ICD-10-CM

## 2017-06-19 DIAGNOSIS — F988 Other specified behavioral and emotional disorders with onset usually occurring in childhood and adolescence: Secondary | ICD-10-CM | POA: Diagnosis not present

## 2017-06-19 DIAGNOSIS — F341 Dysthymic disorder: Secondary | ICD-10-CM

## 2017-06-19 MED ORDER — BUSPIRONE HCL 30 MG PO TABS: ORAL_TABLET | ORAL | 1 refills | Status: DC

## 2017-06-19 MED ORDER — AMPHETAMINE-DEXTROAMPHETAMINE 30 MG PO TABS: ORAL_TABLET | ORAL | 0 refills | Status: DC

## 2017-06-19 NOTE — Progress Notes (Signed)
[facility]   Lucky CowboyWilliam Dunia Pringle, M.D.    Dyanne CarrelAmanda R. Steffanie Dunnollier, P.A.-C      Judd GaudierAshley Corbett, DNP [facility]                [address]                [place], [place] [id number] Telephone [phone] Telefax [phone]  Subjective:    Patient ID: William FillerJonathan B Willis, male    DOB: [birth year], 36 y.o.   MRN: [id number]  HPI    This nice 36 yo MWM with Chronic Anxiety/ Dysthymia and ADD returns today with his wife  for  F/U reporting his failed trial on Wellbutrin after 2-3 weeks feeling somewhat agitated and stopping it. He reports feeling best when taking Adderall and is again advised of concerns of it's addictive potential.       Apparently his issues with Mood date back to early child and he's failed on multiple psychotropic meds in the past. Also, apparently he has had question of Bipolar disorder.  He has been reminded in the past not to mix his medications with Marijuana.  Medication Sig  . busPIRone  10 MG tablet Take 10 mg by mouth 3 (three) times daily.  . carbamazepine  200 MG tablet Take 200 mg by mouth 2 (two) times daily.  . ADDERALL 30 MG tablet Take 1 to 2 tablets / day for ADD - max 5 days or 10 tabs/week - should last 9 weeks til Oct 18 or later  . buPROPion-XL 300 MG  Failed   Past Medical History:  Diagnosis Date  . ADD (attention deficit disorder)   . Anxiety   . Pneumonia    No Known Allergies  Review of Systems  10 point systems review negative except as above.    Objective:   Physical Exam  BP 122/76   Pulse 68   Temp (!) 97.5 F (36.4 C)   Resp 18   Ht 5' 7.5" (1.715 m)   Wt 164 lb 9.6 oz (74.7 kg)   BMI 25.40 kg/m   HEENT - WNL. Neck - supple.  Chest - Clear equal BS. Cor - Nl HS. RRR w/o sig m. MS- FROM w/o deformities.  Gait Nl. Neuro -  Nl w/o focal abnormalities. Mental status- Tangential A & O x 3     Assessment & Plan:   1. Attention deficit disorder (ADD) without  hyperactivity  - amphetamine-dextroamphetamine (ADDERALL) 30 MG tablet; Take 1 to 2 tablets / day for ADD - max 5 days or 10 tabs/week - should last 6 weeks til Dec 10th  Dispense: 60 tablet; Refill: 0  2. Dysthymia   3. Anxiety state  - busPIRone (BUSPAR) 30 MG tablet; Take 1/2 to 1 tablet 2 x / day for Anxiety  Dispense: 180 tablet; Refill: 1  - Discussed meds and SE's . Consider Psychiatric referral for consideration of question of bipolar tendencies.

## 2017-06-19 NOTE — Patient Instructions (Signed)
Persistent Depressive Disorder  Persistent depressive disorder (PDD) is a mental health condition that causes symptoms of low-level depression for 2 years or longer. It may also be called long-term (chronic) depression or dysthymia. PDD may include episodes of more severe depression that last for about 2 weeks (major depressive disorder or MDD). PDD can affect the way you think, feel, and sleep. This condition may also affect your relationships. You may be more likely to get sick if you have PDD. What are the causes? The exact cause of this condition is not known. PDD is most likely caused by a combination of things, which may include:  Genetic factors. These are traits that are passed along from parent to child.  Individual factors. Your personality, your behavior, and the way you handle your thoughts and feelings may contribute to PDD. This includes personality traits and behaviors learned from others.  Physical factors, such as: ? Differences in the part of your brain that controls emotion. This part of your brain may be different than it is in people who do not have PDD. ? Long-term (chronic) medical or psychiatric illnesses.  Social factors. Traumatic experiences or major life changes may play a role in the development of PDD.  What increases the risk? This condition is more likely to develop in women. The following factors may make you more likely to develop PDD:  A family history of depression.  Abnormally low levels of certain brain chemicals.  Traumatic events in childhood, especially abuse or the loss of a parent.  Being under a lot of stress, or long-term stress, especially from upsetting life experiences or losses.  A history of: ? Chronic physical illness. ? Other mental health disorders. ? Substance abuse.  Poor living conditions.  Experiencing social exclusion or discrimination on a regular basis.  What are the signs or symptoms? Symptoms of this condition occur  for most of the day, and may include:  Fatigue or low energy.  Eating too much or too little.  Sleeping too much or too little.  Restlessness or agitation.  Feelings of hopelessness.  Feeling worthless or guilty.  Anxiety.  Poor concentration or difficulty making decisions.  Low self-esteem.  Negative outlook.  Inability to have fun or experience pleasure.  Social withdrawal.  Unexplained physical complaints.  Irritability.  Aggressive behavior or anger.  How is this diagnosed? This condition may be diagnosed based on:  Your symptoms.  Your medical history, including your mental health history. This may involve tests to evaluate your mental health. You may be asked questions about your lifestyle, including any drug and alcohol use, and how long you have had symptoms of PDD.  A physical exam.  Blood tests to rule out other conditions.  You may be diagnosed with PDD if you have had a depressed mood for 2 years or longer, as well as other symptoms of depression. How is this treated? This condition is usually treated by mental health professionals, such as psychologists, psychiatrists, and clinical social workers. You may need more than one type of treatment. Treatment may include:  Psychotherapy. This is also called talk therapy or counseling. Types of psychotherapy include: ? Cognitive behavioral therapy (CBT). This type of therapy teaches you to recognize unhealthy feelings, thoughts, and behaviors, and replace them with positive thoughts and actions. ? Interpersonal therapy (IPT). This helps you to improve the way you relate to and communicate with others. ? Family therapy. This treatment includes members of your family.  Medicine to treat anxiety and  depression, or to help you control certain emotions and behaviors.  Lifestyle changes, such as: ? Limiting alcohol and drug use. ? Exercising regularly. ? Getting plenty of sleep. ? Making healthy eating  choices. ? Spending more time outdoors.  Follow these instructions at home: Activity  Return to your normal activities as told by your health care provider.  Exercise regularly and spend time outdoors as told by your health care provider. General instructions  Take over-the-counter and prescription medicines only as told by your health care provider.  Do not drink alcohol. If you drink alcohol, limit your alcohol intake to no more than 1 drink a day for nonpregnant women and 2 drinks a day for men. One drink equals 12 oz of beer, 5 oz of wine, or 1 oz of hard liquor. Alcohol can affect any antidepressant medicines you are taking. Talk to your health care provider about your alcohol use.  Eat a healthy diet and get plenty of sleep.  Find activities that you enjoy doing, and make time to do them.  Consider joining a support group. Your health care provider may be able to recommend a support group.  Keep all follow-up visits as told by your health care provider. This is important. Where to find more information: National Alliance on Mental Illness  www.nami.org  U.S. National Institute of Mental Health  www.nimh.nih.gov  National Suicide Prevention Lifeline  1-800-273-TALK (8255). This is free, 24-hour help.  Contact a health care provider if:  Your symptoms get worse.  You develop new symptoms.  You have trouble sleeping or doing your daily activities. Get help right away if:  You self-harm.  You have serious thoughts about hurting yourself or others.  You see, hear, taste, smell, or feel things that are not present (hallucinate). This information is not intended to replace advice given to you by your health care provider. Make sure you discuss any questions you have with your health care provider. Document Released: 07/25/2012 Document Revised: 04/07/2016 Document Reviewed: 02/20/2016 Elsevier Interactive Patient Education  2017 Elsevier Inc.  

## 2017-08-02 ENCOUNTER — Other Ambulatory Visit: Payer: Self-pay | Admitting: Internal Medicine

## 2017-08-02 DIAGNOSIS — F988 Other specified behavioral and emotional disorders with onset usually occurring in childhood and adolescence: Secondary | ICD-10-CM

## 2017-08-02 MED ORDER — AMPHETAMINE-DEXTROAMPHETAMINE 30 MG PO TABS: ORAL_TABLET | ORAL | 0 refills | Status: DC

## 2017-08-17 ENCOUNTER — Other Ambulatory Visit: Payer: Self-pay | Admitting: Internal Medicine

## 2017-09-12 ENCOUNTER — Other Ambulatory Visit: Payer: Self-pay | Admitting: Internal Medicine

## 2017-09-12 DIAGNOSIS — F411 Generalized anxiety disorder: Secondary | ICD-10-CM

## 2017-09-12 DIAGNOSIS — F988 Other specified behavioral and emotional disorders with onset usually occurring in childhood and adolescence: Secondary | ICD-10-CM

## 2017-09-12 DIAGNOSIS — F319 Bipolar disorder, unspecified: Secondary | ICD-10-CM

## 2017-09-12 MED ORDER — CARBAMAZEPINE 200 MG PO TABS: ORAL_TABLET | ORAL | 1 refills | Status: DC

## 2017-09-12 MED ORDER — BUSPIRONE HCL 30 MG PO TABS: ORAL_TABLET | ORAL | 1 refills | Status: DC

## 2017-09-12 MED ORDER — AMPHETAMINE-DEXTROAMPHETAMINE 30 MG PO TABS: ORAL_TABLET | ORAL | 0 refills | Status: DC

## 2017-10-05 ENCOUNTER — Encounter (INDEPENDENT_AMBULATORY_CARE_PROVIDER_SITE_OTHER): Payer: Self-pay | Admitting: Orthopaedic Surgery

## 2017-10-05 ENCOUNTER — Ambulatory Visit (INDEPENDENT_AMBULATORY_CARE_PROVIDER_SITE_OTHER): Payer: BLUE CROSS/BLUE SHIELD | Admitting: Orthopaedic Surgery

## 2017-10-05 ENCOUNTER — Ambulatory Visit (INDEPENDENT_AMBULATORY_CARE_PROVIDER_SITE_OTHER): Payer: BLUE CROSS/BLUE SHIELD

## 2017-10-05 DIAGNOSIS — M545 Low back pain, unspecified: Secondary | ICD-10-CM

## 2017-10-05 MED ORDER — PREDNISONE 10 MG (21) PO TBPK: ORAL_TABLET | ORAL | 0 refills | Status: DC

## 2017-10-05 NOTE — Progress Notes (Signed)
Office Visit Note   Patient: William Willis           Date of Birth: [birth year]           MRN: [id number] Visit Date: 10/05/2017              Requested by: Lucky CowboyMcKeown, William, MD [address] [place], [place] [id number] PCP: Lucky CowboyMcKeown, William, MD   Assessment & Plan: Visit Diagnoses:  1. Acute right-sided low back pain without sciatica     Plan: At this point, we not observing any structural abnormalities so we will call in a Sterapred Dosepak with hopes of relief.  If he is not any better in the next few weeks, he will call and let us know we will get an MRI of his back to assess further internal derangement.  Otherwise, follow-up with us on an as needed basis.  Follow-Up Instructions: Return if symptoms worsen or fail to improve.   Orders:  Orders Placed This Encounter  Procedures  . XR Lumbar Spine 2-3 Views  . XR Thoracic Spine 2 View   Meds ordered this encounter  Medications  . predniSONE (STERAPRED UNI-PAK 21 TAB) 10 MG (21) TBPK tablet    Sig: Take as directed    Dispense:  21 tablet    Refill:  0    Order Specific Question:   Supervising Provider    Answer:   Tarry KosXU, NAIPING M [[id number]]      Procedures: No procedures performed   Clinical Data: No additional findings.   Subjective: Chief Complaint  Patient presents with  . 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  No known injury or change in activity however he does admit to lifting pushing pulling a lot on the job.  The pain he has is to the mid to lower thoracic spine.  He describes this as a constant ache with sharp pains when pressure is applied to the areas.  He also notes increased pain when he is standing or sitting for long periods of time.  He is tried Norco as well as ibuprofen with no relief of symptoms.  No numbness tingling burning to the lower  extremities.  No saddle paresthesias.  No bowel or bladder incontinence.  He does note occasional "muscle pulls "but these have resolved over a few days time in the past.  Review of Systems as detailed in HPI.  All others reviewed and are negative.   Objective: Vital Signs: There were no vitals taken for this visit.  Physical Exam well-developed well-nourished gentleman in no acute distress.  Alert and oriented x3.  Ortho Exam examination of his lumbar spine reveals marked spinous and paraspinous tenderness from T8-T12.  Increased pain with lumbar extension greater than flexion.  Markedly positive straight leg raise right greater than left.  He is neurovascular intact distally.  Specialty Comments:  No specialty comments available.  Imaging: Xr Thoracic Spine 2 View  Result Date: 10/05/2017 No structural abnormalities  Xr Lumbar Spine 2-3 Views  Result Date: 10/05/2017 X-rays of the lumbar spine reveal decreased joint space at L4-5 and L5-S1    PMFS History: Patient Active Problem List   Diagnosis Date Noted  . Acute right-sided low back pain without sciatica 10/05/2017  . Dysthymia 09/15/2015  . Anxiety state 12/04/2014  . Vitamin D Deficiency 05/07/2014  .  ADD (attention deficit disorder) 07/24/2013   Past Medical History:  Diagnosis Date  . ADD (attention deficit disorder)   . Anxiety   . Pneumonia     History reviewed. No pertinent family history.  Past Surgical History:  Procedure Laterality Date  . ADENOIDECTOMY    . SHOULDER SURGERY Right 12-08-11  . TONSILLECTOMY     Social History   Occupational History  . Not on file  Tobacco Use  . Smoking status: Current Every Day Smoker    Packs/day: 2.00    Years: 15.00    Pack years: 30.00    Types: Cigarettes  . Smokeless tobacco: Never Used  Substance and Sexual Activity  . Alcohol use: Yes    Comment: rare  . Drug use: Yes    Types: Marijuana  . Sexual activity: Not on file

## 2017-10-05 NOTE — Progress Notes (Signed)
Lumbar

## 2017-10-19 HISTORY — PX: WISDOM TOOTH EXTRACTION: SHX21

## 2017-10-23 NOTE — Progress Notes (Signed)
Complete Physical  Assessment and Plan:  Diagnoses and all orders for this visit:  Encounter for routine adult medical exam with abnormal findings  Attention deficit disorder (ADD) without hyperactivity Continue medications Helps with focus, no AE's. The patient was counseled on the addictive nature of the medication and was encouraged to take drug holidays when not needed.   Anxiety state/ Dysthymia Well managed by current regimen; continue medications Stress management techniques discussed, increase water, good sleep hygiene discussed, increase exercise, and increase veggies.   Tobacco use Discussed risks associated with tobacco use and advised to reduce or quit Patient is not ready to do so, but advised to consider strongly Will follow up at the next visit  Vitamin D deficiency -     VITAMIN D 25 Hydroxy (Vit-D Deficiency, Fractures)  Screening cholesterol level -     Lipid panel  Screening for cardiovascular condition -     EKG 12-Lead  Screening for diabetes mellitus -     Hemoglobin A1c  Screening for hematuria or proteinuria -     Urinalysis w microscopic + reflex cultur -     Microalbumin / creatinine urine ratio  Screening for thyroid disorder -     TSH  Medication management -     CBC with Differential/Platelet -     BASIC METABOLIC PANEL WITH GFR -     Hepatic function panel  Discussed med's effects and SE's. Screening labs and tests as requested with regular follow-up as recommended. Over 40 minutes of exam, counseling, chart review and critical decision making was performed  Future Appointments  Date Time Provider Department Center  10/25/2018  3:00 PM Judd Gaudierorbett, Melinda Gwinner, NP [facility] None    HPI 37 year old Male, married Patient with 2 children, presents for a complete physical. has ADD (attention deficit disorder); Vitamin D deficiency; Anxiety state; Dysthymia; and Acute right-sided low back pain without sciatica on their problem list. He was  recently seen by Dr. Roda ShuttersXu for acute R sided lower back pain, unremarkable plain films and given a steroid taper, reports symptoms have improved but not resolved.   BMI is Body mass index is 26.59 kg/m., he has not been working on diet and exercise.  He reports he eats lots of fruit; eats "at least a vegetable every day."  Reports he drinks 3-4 bottles of water daily, avoids sodas, does admit to drinking nearly a gallon of sweet tea daily.  Wt Readings from Last 3 Encounters:  10/24/17 166 lb (75.3 kg)  06/19/17 164 lb 9.6 oz (74.7 kg)  04/06/17 156 lb 9.6 oz (71 kg)   today their BP is BP: 110/78 He does not workout but works a physically intense job. He denies chest pain, shortness of breath, dizziness.   Patient is on an ADD medication, he states that the medication is helping and he denies any adverse reactions. He typically takes this as needed for days that he works. Does not take on days off.   he has a diagnosis of anxiety/mood imbalance and is currently on carbamazepine 200 mg TID, and buspra 30 mg twice day, reports symptoms are well controlled on current regimen.   Current Medications:  Current Outpatient Medications on File Prior to Visit  Medication Sig Dispense Refill  . amphetamine-dextroamphetamine (ADDERALL) 30 MG tablet Take 1 to 2 tablets / day for ADD - max 5 days or 10 tabs/week - should last 6 weeks til  March 5th 60 tablet 0  . busPIRone (BUSPAR) 30 MG tablet  Take 1/2 to 1 tablet 2 x / day for Anxiety 180 tablet 1  . carbamazepine (EPITOL) 200 MG tablet Take 1 tablet 3 x / day for Mood 270 tablet 1  . predniSONE (STERAPRED UNI-PAK 21 TAB) 10 MG (21) TBPK tablet Take as directed (Patient not taking: Reported on 10/24/2017) 21 tablet 0   No current facility-administered medications on file prior to visit.    Allergies:  No Known Allergies Health Maintenance:  Immunization History  Administered Date(s) Administered  . Tdap 02/08/2015    Tetanus: 2016 Flu vaccine:  Declines HPV: 0/3  Colonoscopy: n/a EGD: n/a  Eye Exam: Remote - no issues Dentist: Went last week; seeing Dr. Marland Kitchen Up in [place]   Patient Care Team: Lucky Cowboy, MD as PCP - General (Internal Medicine)  Medical History:  has ADD (attention deficit disorder); Vitamin D deficiency; Anxiety state; Dysthymia; and Acute right-sided low back pain without sciatica on their problem list. Surgical History:  He  has a past surgical history that includes Shoulder surgery (Right, 12-08-11); Tonsillectomy; Adenoidectomy; and Wisdom tooth extraction (10/19/2017). Family History:  His family history includes Heart attack in his maternal grandfather; Stroke in his maternal grandfather. Social History:   reports that he has been smoking cigarettes.  He has a 30.00 pack-year smoking history. he has never used smokeless tobacco. He reports that he drinks alcohol. He reports that he uses drugs. Drug: Marijuana. Review of Systems:  Review of Systems  Constitutional: Negative for malaise/fatigue and weight loss.  HENT: Negative for hearing loss and tinnitus.   Eyes: Negative for blurred vision and double vision.  Respiratory: Negative for cough, sputum production, shortness of breath and wheezing.   Cardiovascular: Negative for chest pain, palpitations, orthopnea, claudication and leg swelling.  Gastrointestinal: Negative for abdominal pain, blood in stool, constipation, diarrhea, heartburn, melena, nausea and vomiting.  Genitourinary: Negative.   Musculoskeletal: Negative for joint pain and myalgias.  Skin: Negative for rash.  Neurological: Negative for dizziness, tingling, sensory change, weakness and headaches.  Endo/Heme/Allergies: Negative for environmental allergies and polydipsia.  Psychiatric/Behavioral: Negative.  Negative for depression, memory loss and substance abuse. The patient is not nervous/anxious and does not have insomnia.   All other systems reviewed and are  negative.   Physical Exam: Estimated body mass index is 26.59 kg/m as calculated from the following:   Height as of this encounter: 5' 6.25" (1.683 m).   Weight as of this encounter: 166 lb (75.3 kg). BP 110/78   Pulse 82   Temp 97.7 F (36.5 C)   Ht 5' 6.25" (1.683 m)   Wt 166 lb (75.3 kg)   SpO2 96%   BMI 26.59 kg/m  General Appearance: Well nourished, in no apparent distress.  Eyes: PERRLA, EOMs, conjunctiva no swelling or erythema, normal fundi and vessels.  Sinuses: No Frontal/maxillary tenderness  ENT/Mouth: Ext aud canals clear, normal light reflex with TMs without erythema, bulging. Good dentition. No erythema, swelling, or exudate on post pharynx. Tonsils not swollen or erythematous. Hearing normal.  Neck: Supple, thyroid normal. No bruits  Respiratory: Respiratory effort normal, BS equal bilaterally without rales, rhonchi, wheezing or stridor.  Cardio: RRR with mild 2/6 early systolic murmur over R 2nd ICS without radiation, rubs or gallops. Brisk peripheral pulses without edema.  Chest: symmetric, with normal excursions and percussion.  Abdomen: Soft, nontender, no guarding, rebound, hernias, masses, or organomegaly.  Lymphatics: Non tender without lymphadenopathy.  Genitourinary: No concerns - defer Musculoskeletal: Full ROM all peripheral extremities,5/5 strength, and  normal gait.  Skin: Warm, dry without rashes, lesions, ecchymosis. Neuro: Cranial nerves intact, reflexes equal bilaterally. Normal muscle tone, no cerebellar symptoms. Sensation intact.  Psych: Awake and oriented X 3, normal affect, Insight and Judgment appropriate.   EKG: WNL- RVR' S1/S2- obtained for baseline  Dan Maker 3:23 PM [facility]

## 2017-10-24 ENCOUNTER — Ambulatory Visit (INDEPENDENT_AMBULATORY_CARE_PROVIDER_SITE_OTHER): Payer: BLUE CROSS/BLUE SHIELD | Admitting: Adult Health

## 2017-10-24 ENCOUNTER — Encounter: Payer: Self-pay | Admitting: Adult Health

## 2017-10-24 VITALS — BP 110/78 | HR 82 | Temp 97.7°F | Ht 66.25 in | Wt 166.0 lb

## 2017-10-24 DIAGNOSIS — Z0001 Encounter for general adult medical examination with abnormal findings: Secondary | ICD-10-CM

## 2017-10-24 DIAGNOSIS — Z13220 Encounter for screening for lipoid disorders: Secondary | ICD-10-CM

## 2017-10-24 DIAGNOSIS — Z1389 Encounter for screening for other disorder: Secondary | ICD-10-CM

## 2017-10-24 DIAGNOSIS — Z1329 Encounter for screening for other suspected endocrine disorder: Secondary | ICD-10-CM | POA: Diagnosis not present

## 2017-10-24 DIAGNOSIS — Z79899 Other long term (current) drug therapy: Secondary | ICD-10-CM

## 2017-10-24 DIAGNOSIS — F411 Generalized anxiety disorder: Secondary | ICD-10-CM

## 2017-10-24 DIAGNOSIS — E559 Vitamin D deficiency, unspecified: Secondary | ICD-10-CM

## 2017-10-24 DIAGNOSIS — F988 Other specified behavioral and emotional disorders with onset usually occurring in childhood and adolescence: Secondary | ICD-10-CM

## 2017-10-24 DIAGNOSIS — Z136 Encounter for screening for cardiovascular disorders: Secondary | ICD-10-CM | POA: Diagnosis not present

## 2017-10-24 DIAGNOSIS — I10 Essential (primary) hypertension: Secondary | ICD-10-CM | POA: Diagnosis not present

## 2017-10-24 DIAGNOSIS — Z87891 Personal history of nicotine dependence: Secondary | ICD-10-CM | POA: Insufficient documentation

## 2017-10-24 DIAGNOSIS — Z0000 Encounter for general adult medical examination without abnormal findings: Secondary | ICD-10-CM | POA: Diagnosis not present

## 2017-10-24 DIAGNOSIS — Z131 Encounter for screening for diabetes mellitus: Secondary | ICD-10-CM

## 2017-10-24 DIAGNOSIS — F341 Dysthymic disorder: Secondary | ICD-10-CM

## 2017-10-24 MED ORDER — AMPHETAMINE-DEXTROAMPHETAMINE 30 MG PO TABS: ORAL_TABLET | ORAL | 0 refills | Status: DC

## 2017-10-24 NOTE — Patient Instructions (Signed)
Recommend: switching to unsweet tea, aiming for plenty of water - for long term health and teeth  Think about cutting down on smoking -   Here is some information to help you keep your heart healthy: Move it! - Aim for 30 mins of activity every day. Take it slowly at first. Talk to [place] before starting any new exercise program.   Lose it.  -Body Mass Index (BMI) can indicate if you need to lose weight. A healthy range is 18.5-24.9. For a BMI calculator, go to [employer]  Waist Management -Excess abdominal fat is a risk factor for heart disease, diabetes, asthma, stroke and more. Ideal waist circumference is less than 35" for women and less than 40" for men.   Eat Right -focus on fruits, vegetables, whole grains, and meals you make yourself. Avoid foods with trans fat and high sugar/sodium content.   Snooze or Snore? - Loud snoring can be a sign of sleep apnea, a significant risk factor for high blood pressure, heart attach, stroke, and heart arrhythmias.  Kick the habit -Quit Smoking! Avoid second hand smoke. A single cigarette raises your blood pressure for 20 mins and increases the risk of heart attack and stroke for the next 24 hours.   Are Aspirin and Supplements right for you? -Add ENTERIC COATED low dose 81 mg Aspirin daily OR can do every other day if you have easy bruising to protect your heart and head. As well as to reduce risk of Colon Cancer by 20 %, Skin Cancer by 26 % , Melanoma by 46% and Pancreatic cancer by 60%  Say "No to Stress -There may be little you can do about problems that cause stress. However, techniques such as long walks, meditation, and exercise can help you manage it.   Start Now! - Make changes one at a time and set reasonable goals to increase your likelihood of success.     Aim for 7+ servings of fruits and vegetables daily  80+ fluid ounces of water or unsweet tea for healthy kidneys  Limit alcohol intake  Limit animal fats in diet for  cholesterol and heart health - choose grass fed whenever available  Aim for low stress - take time to unwind and care for your mental health  Aim for 150 min of moderate intensity exercise weekly for heart health, and weights twice weekly for bone health  Aim for 7-9 hours of sleep daily    Back Exercises The following exercises strengthen the muscles that help to support the back. They also help to keep the lower back flexible. Doing these exercises can help to prevent back pain or lessen existing pain. If you have back pain or discomfort, try doing these exercises 2-3 times each day or as told by your health care provider. When the pain goes away, do them once each day, but increase the number of times that you repeat the steps for each exercise (do more repetitions). If you do not have back pain or discomfort, do these exercises once each day or as told by your health care provider. Exercises Single Knee to Chest  Repeat these steps 3-5 times for each leg: 1. Lie on your back on a firm bed or the floor with your legs extended. 2. Bring one knee to your chest. Your other leg should stay extended and in contact with the floor. 3. Hold your knee in place by grabbing your knee or thigh. 4. Pull on your knee until you feel a gentle stretch  in your lower back. 5. Hold the stretch for 10-30 seconds. 6. Slowly release and straighten your leg.  Pelvic Tilt  Repeat these steps 5-10 times: 1. Lie on your back on a firm bed or the floor with your legs extended. 2. Bend your knees so they are pointing toward the ceiling and your feet are flat on the floor. 3. Tighten your lower abdominal muscles to press your lower back against the floor. This motion will tilt your pelvis so your tailbone points up toward the ceiling instead of pointing to your feet or the floor. 4. With gentle tension and even breathing, hold this position for 5-10 seconds.  Cat-Cow  Repeat these steps until your lower back  becomes more flexible: 1. Get into a hands-and-knees position on a firm surface. Keep your hands under your shoulders, and keep your knees under your hips. You may place padding under your knees for comfort. 2. Let your head hang down, and point your tailbone toward the floor so your lower back becomes rounded like the back of a cat. 3. Hold this position for 5 seconds. 4. Slowly lift your head and point your tailbone up toward the ceiling so your back forms a sagging arch like the back of a cow. 5. Hold this position for 5 seconds.  Press-Ups  Repeat these steps 5-10 times: 1. Lie on your abdomen (face-down) on the floor. 2. Place your palms near your head, about shoulder-width apart. 3. While you keep your back as relaxed as possible and keep your hips on the floor, slowly straighten your arms to raise the top half of your body and lift your shoulders. Do not use your back muscles to raise your upper torso. You may adjust the placement of your hands to make yourself more comfortable. 4. Hold this position for 5 seconds while you keep your back relaxed. 5. Slowly return to lying flat on the floor.  Bridges  Repeat these steps 10 times: 1. Lie on your back on a firm surface. 2. Bend your knees so they are pointing toward the ceiling and your feet are flat on the floor. 3. Tighten your buttocks muscles and lift your buttocks off of the floor until your waist is at almost the same height as your knees. You should feel the muscles working in your buttocks and the back of your thighs. If you do not feel these muscles, slide your feet 1-2 inches farther away from your buttocks. 4. Hold this position for 3-5 seconds. 5. Slowly lower your hips to the starting position, and allow your buttocks muscles to relax completely.  If this exercise is too easy, try doing it with your arms crossed over your chest. Abdominal Crunches  Repeat these steps 5-10 times: 1. Lie on your back on a firm bed or the  floor with your legs extended. 2. Bend your knees so they are pointing toward the ceiling and your feet are flat on the floor. 3. Cross your arms over your chest. 4. Tip your chin slightly toward your chest without bending your neck. 5. Tighten your abdominal muscles and slowly raise your trunk (torso) high enough to lift your shoulder blades a tiny bit off of the floor. Avoid raising your torso higher than that, because it can put too much stress on your low back and it does not help to strengthen your abdominal muscles. 6. Slowly return to your starting position.  Back Lifts Repeat these steps 5-10 times: 1. Lie on your abdomen (face-down)  with your arms at your sides, and rest your forehead on the floor. 2. Tighten the muscles in your legs and your buttocks. 3. Slowly lift your chest off of the floor while you keep your hips pressed to the floor. Keep the back of your head in line with the curve in your back. Your eyes should be looking at the floor. 4. Hold this position for 3-5 seconds. 5. Slowly return to your starting position.  Contact a health care provider if:  Your back pain or discomfort gets much worse when you do an exercise.  Your back pain or discomfort does not lessen within 2 hours after you exercise. If you have any of these problems, stop doing these exercises right away. Do not do them again unless your health care provider says that you can. Get help right away if:  You develop sudden, severe back pain. If this happens, stop doing the exercises right away. Do not do them again unless your health care provider says that you can. This information is not intended to replace advice given to you by your health care provider. Make sure you discuss any questions you have with your health care provider. Document Released: 09/15/2004 Document Revised: 12/16/2015 Document Reviewed: 10/02/2014 Elsevier Interactive Patient Education  2017 [employer].

## 2017-10-25 LAB — CBC WITH DIFFERENTIAL/PLATELET
BASOS ABS: 63 {cells}/uL (ref 0–200)
Basophils Relative: 0.5 %
EOS ABS: 63 {cells}/uL (ref 15–500)
Eosinophils Relative: 0.5 %
HEMATOCRIT: 39.5 % (ref 38.5–50.0)
Hemoglobin: 14 g/dL (ref 13.2–17.1)
Lymphs Abs: 3780 cells/uL (ref 850–3900)
MCH: 29.5 pg (ref 27.0–33.0)
MCHC: 35.4 g/dL (ref 32.0–36.0)
MCV: 83.3 fL (ref 80.0–100.0)
MPV: 9.3 fL (ref 7.5–12.5)
Monocytes Relative: 6.1 %
NEUTROS PCT: 62.9 %
Neutro Abs: 7925 cells/uL — ABNORMAL HIGH (ref 1500–7800)
PLATELETS: 244 10*3/uL (ref 140–400)
RBC: 4.74 10*6/uL (ref 4.20–5.80)
RDW: 12.1 % (ref 11.0–15.0)
TOTAL LYMPHOCYTE: 30 %
WBC mixed population: 769 cells/uL (ref 200–950)
WBC: 12.6 10*3/uL — AB (ref 3.8–10.8)

## 2017-10-25 LAB — LIPID PANEL
Cholesterol: 174 mg/dL (ref <200)
HDL: 44 mg/dL (ref >40)
LDL CHOLESTEROL (CALC): 109 mg/dL — AB
NON-HDL CHOLESTEROL (CALC): 130 mg/dL — AB (ref <130)
TRIGLYCERIDES: 100 mg/dL (ref <150)
Total CHOL/HDL Ratio: 4 (calc) (ref <5.0)

## 2017-10-25 LAB — URINALYSIS W MICROSCOPIC + REFLEX CULTURE
BACTERIA UA: NONE SEEN /HPF
Bilirubin Urine: NEGATIVE
Glucose, UA: NEGATIVE
HGB URINE DIPSTICK: NEGATIVE
HYALINE CAST: NONE SEEN /LPF
Ketones, ur: NEGATIVE
Leukocyte Esterase: NEGATIVE
Nitrites, Initial: NEGATIVE
PROTEIN: NEGATIVE
SQUAMOUS EPITHELIAL / LPF: NONE SEEN /HPF (ref <=5)
Specific Gravity, Urine: 1.025 (ref 1.001–1.03)
WBC UA: NONE SEEN /HPF (ref 0–5)
pH: 6.5 (ref 5.0–8.0)

## 2017-10-25 LAB — BASIC METABOLIC PANEL WITH GFR
BUN: 13 mg/dL (ref 7–25)
CO2: 29 mmol/L (ref 20–32)
CREATININE: 0.98 mg/dL (ref 0.60–1.35)
Calcium: 9 mg/dL (ref 8.6–10.3)
Chloride: 103 mmol/L (ref 98–110)
GFR, EST AFRICAN AMERICAN: 115 mL/min/{1.73_m2} (ref >=60)
GFR, EST NON AFRICAN AMERICAN: 99 mL/min/{1.73_m2} (ref >=60)
Glucose, Bld: 79 mg/dL (ref 65–99)
Potassium: 3.9 mmol/L (ref 3.5–5.3)
SODIUM: 140 mmol/L (ref 135–146)

## 2017-10-25 LAB — TSH: TSH: 1.27 mIU/L (ref 0.40–4.50)

## 2017-10-25 LAB — MICROALBUMIN / CREATININE URINE RATIO
Creatinine, Urine: 176 mg/dL (ref 20–320)
MICROALB UR: 0.5 mg/dL
MICROALB/CREAT RATIO: 3 ug/mg{creat} (ref <30)

## 2017-10-25 LAB — HEPATIC FUNCTION PANEL
AG RATIO: 2.2 (calc) (ref 1.0–2.5)
ALBUMIN MSPROF: 4.1 g/dL (ref 3.6–5.1)
ALT: 16 U/L (ref 9–46)
AST: 18 U/L (ref 10–40)
Alkaline phosphatase (APISO): 67 U/L (ref 40–115)
BILIRUBIN DIRECT: 0.1 mg/dL (ref 0.0–0.2)
BILIRUBIN TOTAL: 0.3 mg/dL (ref 0.2–1.2)
Globulin: 1.9 g/dL (calc) (ref 1.9–3.7)
Indirect Bilirubin: 0.2 mg/dL (calc) (ref 0.2–1.2)
Total Protein: 6 g/dL — ABNORMAL LOW (ref 6.1–8.1)

## 2017-10-25 LAB — VITAMIN D 25 HYDROXY (VIT D DEFICIENCY, FRACTURES): Vit D, 25-Hydroxy: 33 ng/mL (ref 30–100)

## 2017-10-25 LAB — HEMOGLOBIN A1C
HEMOGLOBIN A1C: 5 %{Hb} (ref <5.7)
Mean Plasma Glucose: 97 (calc)
eAG (mmol/L): 5.4 (calc)

## 2017-10-25 LAB — NO CULTURE INDICATED

## 2017-10-31 ENCOUNTER — Telehealth: Payer: Self-pay

## 2017-10-31 NOTE — Telephone Encounter (Signed)
Patient has my chart but hasn't set it up yet. Requesting a call back about lab results.

## 2017-10-31 NOTE — Telephone Encounter (Signed)
Labs given to patient  

## 2017-12-06 ENCOUNTER — Other Ambulatory Visit: Payer: Self-pay

## 2017-12-06 DIAGNOSIS — F988 Other specified behavioral and emotional disorders with onset usually occurring in childhood and adolescence: Secondary | ICD-10-CM

## 2017-12-06 MED ORDER — AMPHETAMINE-DEXTROAMPHETAMINE 30 MG PO TABS: ORAL_TABLET | ORAL | 0 refills | Status: DC

## 2017-12-06 NOTE — Telephone Encounter (Signed)
Refill request for Adderall. Last office visit on 10/24/17. Next office visit on 05/07/18. In que for your review.

## 2017-12-13 ENCOUNTER — Encounter: Payer: Self-pay | Admitting: Emergency Medicine

## 2017-12-13 ENCOUNTER — Other Ambulatory Visit: Payer: Self-pay

## 2017-12-13 ENCOUNTER — Emergency Department: Payer: BLUE CROSS/BLUE SHIELD

## 2017-12-13 ENCOUNTER — Emergency Department
Admission: EM | Admit: 2017-12-13 | Discharge: 2017-12-13 | Disposition: A | Discharge status: Home / Self Care | Payer: BLUE CROSS/BLUE SHIELD | Attending: Emergency Medicine | Admitting: Emergency Medicine

## 2017-12-13 DIAGNOSIS — M25561 Pain in right knee: Secondary | ICD-10-CM | POA: Diagnosis not present

## 2017-12-13 DIAGNOSIS — M25511 Pain in right shoulder: Secondary | ICD-10-CM | POA: Diagnosis not present

## 2017-12-13 DIAGNOSIS — S8001XA Contusion of right knee, initial encounter: Secondary | ICD-10-CM | POA: Insufficient documentation

## 2017-12-13 DIAGNOSIS — S161XXA Strain of muscle, fascia and tendon at neck level, initial encounter: Secondary | ICD-10-CM

## 2017-12-13 DIAGNOSIS — S46911A Strain of unspecified muscle, fascia and tendon at shoulder and upper arm level, right arm, initial encounter: Secondary | ICD-10-CM | POA: Diagnosis not present

## 2017-12-13 DIAGNOSIS — Z79899 Other long term (current) drug therapy: Secondary | ICD-10-CM | POA: Insufficient documentation

## 2017-12-13 DIAGNOSIS — Y999 Unspecified external cause status: Secondary | ICD-10-CM | POA: Insufficient documentation

## 2017-12-13 DIAGNOSIS — Y92410 Unspecified street and highway as the place of occurrence of the external cause: Secondary | ICD-10-CM | POA: Insufficient documentation

## 2017-12-13 DIAGNOSIS — M542 Cervicalgia: Secondary | ICD-10-CM | POA: Diagnosis not present

## 2017-12-13 DIAGNOSIS — F17210 Nicotine dependence, cigarettes, uncomplicated: Secondary | ICD-10-CM | POA: Diagnosis not present

## 2017-12-13 DIAGNOSIS — S199XXA Unspecified injury of neck, initial encounter: Secondary | ICD-10-CM | POA: Diagnosis not present

## 2017-12-13 DIAGNOSIS — Y939 Activity, unspecified: Secondary | ICD-10-CM | POA: Diagnosis not present

## 2017-12-13 MED ORDER — MELOXICAM 15 MG PO TABS: 15.0000 mg | ORAL_TABLET | Freq: Every day | ORAL | 2 refills | Status: DC

## 2017-12-13 MED ORDER — BACLOFEN 10 MG PO TABS: 10.0000 mg | ORAL_TABLET | Freq: Every day | ORAL | 1 refills | Status: DC
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
## 2017-12-14 DIAGNOSIS — M25511 Pain in right shoulder: Secondary | ICD-10-CM | POA: Diagnosis not present

## 2017-12-19 ENCOUNTER — Encounter: Payer: Self-pay | Admitting: Internal Medicine

## 2017-12-25 DIAGNOSIS — S43431A Superior glenoid labrum lesion of right shoulder, initial encounter: Secondary | ICD-10-CM | POA: Diagnosis not present

## 2017-12-29 ENCOUNTER — Other Ambulatory Visit: Payer: Self-pay

## 2017-12-29 DIAGNOSIS — F988 Other specified behavioral and emotional disorders with onset usually occurring in childhood and adolescence: Secondary | ICD-10-CM

## 2017-12-29 MED ORDER — AMPHETAMINE-DEXTROAMPHETAMINE 30 MG PO TABS: ORAL_TABLET | ORAL | 0 refills | Status: DC

## 2017-12-29 NOTE — Telephone Encounter (Signed)
Patient notified

## 2017-12-29 NOTE — Telephone Encounter (Signed)
Adderall refill request. Per pharmacy, patient last filled this on 10/29/17. Please send in new prescription.

## 2018-01-09 DIAGNOSIS — S43431A Superior glenoid labrum lesion of right shoulder, initial encounter: Secondary | ICD-10-CM | POA: Diagnosis not present

## 2018-01-17 DIAGNOSIS — M25511 Pain in right shoulder: Secondary | ICD-10-CM | POA: Diagnosis not present

## 2018-01-26 DIAGNOSIS — S43431A Superior glenoid labrum lesion of right shoulder, initial encounter: Secondary | ICD-10-CM | POA: Diagnosis not present

## 2018-02-15 ENCOUNTER — Other Ambulatory Visit: Payer: Self-pay | Admitting: Internal Medicine

## 2018-02-15 DIAGNOSIS — F988 Other specified behavioral and emotional disorders with onset usually occurring in childhood and adolescence: Secondary | ICD-10-CM

## 2018-02-15 MED ORDER — AMPHETAMINE-DEXTROAMPHETAMINE 30 MG PO TABS: ORAL_TABLET | ORAL | 0 refills | Status: DC

## 2018-03-27 ENCOUNTER — Other Ambulatory Visit: Payer: Self-pay

## 2018-03-27 DIAGNOSIS — F988 Other specified behavioral and emotional disorders with onset usually occurring in childhood and adolescence: Secondary | ICD-10-CM

## 2018-03-27 NOTE — Telephone Encounter (Signed)
Adderall refill request. PMP checked, last filled on 02/15/18, #60, 30 day supply. Last office visit on 10/24/17, next office visit on 05/07/18. In que for review

## 2018-03-28 MED ORDER — AMPHETAMINE-DEXTROAMPHETAMINE 30 MG PO TABS: ORAL_TABLET | ORAL | 0 refills | Status: DC

## 2018-05-07 ENCOUNTER — Ambulatory Visit: Payer: Self-pay | Admitting: Adult Health

## 2018-05-10 DIAGNOSIS — E663 Overweight: Secondary | ICD-10-CM | POA: Insufficient documentation

## 2018-05-10 DIAGNOSIS — E785 Hyperlipidemia, unspecified: Secondary | ICD-10-CM | POA: Insufficient documentation

## 2018-05-10 DIAGNOSIS — E782 Mixed hyperlipidemia: Secondary | ICD-10-CM | POA: Insufficient documentation

## 2018-05-10 NOTE — Progress Notes (Signed)
FOLLOW UP  Assessment and Plan:   Diagnoses and all orders for this visit:  Dysthymia/Anxiety state Continue medications, reports currently well controlled Lifestyle discussed: diet/exerise, sleep hygiene, stress management, hydration  Attention deficit disorder (ADD) without hyperactivity Continue medications Helps with focus, no AE's. The patient was counseled on the addictive nature of the medication and was encouraged to take drug holidays when not needed.   Vitamin D deficiency Patient has initiated supplement Discussed goal 70-100 Defer vitamin D level  History of smoking 30 or more pack years Discussed risks associated with tobacco use and advised to reduce or quit Patient is not ready to do so, but advised to consider strongly Will follow up at the next visit  Pure hyperglyceridemia Recommended low cholesterol diet and exercise.  Check lipid panel at [facility]   Overweight (BMI 25.0-29.9) Continue to recommend diet heavy in fruits and veggies and low in animal meats, cheeses, and dairy products, appropriate calorie intake Discuss exercise recommendations routinely Continue to monitor weight at each visit    Continue diet and meds as discussed. Further disposition pending results of labs. Discussed med's effects and SE's.   Over 30 minutes of exam, counseling, chart review, and critical decision making was performed.   Future Appointments  Date Time Provider Department Center  10/25/2018  3:00 PM Judd Gaudierorbett, Shenia Alan, NP [facility] None    ----------------------------------------------------------------------------------------------------------------------  HPI 37 y.o. male  presents for 6 month follow up on mood/anxiety, ADD, smoking, hyperlipidemia, weight, vitamin D deficiency.   he currently continues to smoke 2 pack a day; discussed risks associated with smoking, patient is not ready to quit.   he has a diagnosis of dysthymia/anxiety, questionable bipolar with hx  of mood disorder since childhood; has failed multiple mood agents, most recently wellbutrin; currently on carbamazepine 200 mg TID, buspar 15-30 mg BID (currently taking 15 mg BID), reports symptoms are well controlled on current regimen. he reports current medication regimen has given best control over the years of trial/error.   Patient is on an ADD medication, takes 1.5 tabs on days that he works, 1/2 tab on days he's off. he states that the medication is helping and he denies any adverse reactions.   BMI is Body mass index is 25.53 kg/m., he has not been working on diet and exercise, physically intense job replacing HVAC. He admits to eating cheeseburgers and fries most days.  Wt Readings from Last 3 Encounters:  05/11/18 163 lb (73.9 kg)  12/13/17 170 lb (77.1 kg)  10/24/17 166 lb (75.3 kg)   Today their BP is BP: 102/76  He does not workout. He denies chest pain, shortness of breath, dizziness.   He is not on cholesterol medication. His cholesterol is not at goal. The cholesterol last visit was:  Lab Results  Component Value Date   CHOL 174 10/24/2017   HDL 44 10/24/2017   LDLCALC 109 (H) 10/24/2017   TRIG 100 10/24/2017   CHOLHDL 4.0 10/24/2017    Patient is newly on Vitamin D supplement.   Lab Results  Component Value Date   VD25OH 33 10/24/2017        Current Medications:  Current Outpatient Medications on File Prior to Visit  Medication Sig  . amphetamine-dextroamphetamine (ADDERALL) 30 MG tablet Take 1/2 to 1or 2 tablets /day & please try to limit to 5 days /week (10 tab/week) to avoid addiction (Last 6 weeks til Sept 19th)  . baclofen (LIORESAL) 10 MG tablet Take 1 tablet (10 mg total) by mouth  daily.  . busPIRone (BUSPAR) 30 MG tablet Take 1/2 to 1 tablet 2 x / day for Anxiety  . carbamazepine (EPITOL) 200 MG tablet Take 1 tablet 3 x / day for Mood  . meloxicam (MOBIC) 15 MG tablet Take 1 tablet (15 mg total) by mouth daily.   No current facility-administered  medications on file prior to visit.      Allergies: No Known Allergies   Medical History:  Past Medical History:  Diagnosis Date  . ADD (attention deficit disorder)   . Anxiety   . Pneumonia    Family history- Reviewed and unchanged Social history- Reviewed and unchanged   Review of Systems:  Review of Systems  Constitutional: Negative for malaise/fatigue and weight loss.  HENT: Negative for hearing loss and tinnitus.   Eyes: Negative for blurred vision and double vision.  Respiratory: Negative for cough, shortness of breath and wheezing.   Cardiovascular: Negative for chest pain, palpitations, orthopnea, claudication and leg swelling.  Gastrointestinal: Negative for abdominal pain, blood in stool, constipation, diarrhea, heartburn, melena, nausea and vomiting.  Genitourinary: Negative.   Musculoskeletal: Negative for joint pain and myalgias.  Skin: Negative for rash.  Neurological: Negative for dizziness, tingling, sensory change, weakness and headaches.  Endo/Heme/Allergies: Negative for polydipsia.  Psychiatric/Behavioral: Negative.   All other systems reviewed and are negative.    Physical Exam: BP 102/76   Pulse 69   Temp (!) 97.5 F (36.4 C)   Wt 163 lb (73.9 kg)   SpO2 98%   BMI 25.53 kg/m  Wt Readings from Last 3 Encounters:  05/11/18 163 lb (73.9 kg)  12/13/17 170 lb (77.1 kg)  10/24/17 166 lb (75.3 kg)   General Appearance: Well nourished, in no apparent distress. Eyes: PERRLA, EOMs, conjunctiva no swelling or erythema Sinuses: No Frontal/maxillary tenderness ENT/Mouth: Ext aud canals clear, TMs without erythema, bulging. No erythema, swelling, or exudate on post pharynx.  Tonsils not swollen or erythematous. Hearing normal.  Neck: Supple, thyroid normal.  Respiratory: Respiratory effort normal, BS equal bilaterally without rales, rhonchi, wheezing or stridor.  Cardio: RRR with coarse 2/6 systolic murmur. Brisk peripheral pulses without edema.   Abdomen: Soft, + BS.  Non tender, no guarding, rebound, hernias, masses. Lymphatics: Non tender without lymphadenopathy.  Musculoskeletal: Full ROM, 5/5 strength, Normal gait Skin: Warm, dry without rashes, lesions, ecchymosis.  Neuro: Cranial nerves intact. No cerebellar symptoms.  Psych: Awake and oriented X 3, normal affect, Insight and Judgment appropriate.    Dan Maker, NP 10:12 AM [facility]

## 2018-05-11 ENCOUNTER — Encounter: Payer: Self-pay | Admitting: Adult Health

## 2018-05-11 ENCOUNTER — Ambulatory Visit: Payer: BLUE CROSS/BLUE SHIELD | Admitting: Adult Health

## 2018-05-11 VITALS — BP 102/76 | HR 69 | Temp 97.5°F | Wt 163.0 lb

## 2018-05-11 DIAGNOSIS — E559 Vitamin D deficiency, unspecified: Secondary | ICD-10-CM

## 2018-05-11 DIAGNOSIS — F411 Generalized anxiety disorder: Secondary | ICD-10-CM

## 2018-05-11 DIAGNOSIS — E663 Overweight: Secondary | ICD-10-CM

## 2018-05-11 DIAGNOSIS — F988 Other specified behavioral and emotional disorders with onset usually occurring in childhood and adolescence: Secondary | ICD-10-CM

## 2018-05-11 DIAGNOSIS — Z87891 Personal history of nicotine dependence: Secondary | ICD-10-CM

## 2018-05-11 DIAGNOSIS — E781 Pure hyperglyceridemia: Secondary | ICD-10-CM

## 2018-05-11 DIAGNOSIS — F341 Dysthymic disorder: Secondary | ICD-10-CM | POA: Diagnosis not present

## 2018-05-11 MED ORDER — AMPHETAMINE-DEXTROAMPHETAMINE 30 MG PO TABS: ORAL_TABLET | ORAL | 0 refills | Status: DC

## 2018-05-11 NOTE — Patient Instructions (Signed)
Recommend working on 1 thing - cutting down on soda intake.   Cut back gradually and try to replace with water, unsweet tea, or stevia/erythrytol sweetened beverages.    Recommend cutting down on sugar intake  - the American Heart Association recommends no more than 9 teaspoons (38 g) of added sugar for men daily, and 6 teaspoons (25 g) for women. Added sugar can be in many things that you might not expect - salad dressings, bread that is not home made, "all natural" fruit juice, etc., most processed foods contain hidden sugars. Consider looking at labels and being aware of how much sugar you are consuming in a day. Less is always better for sugar; sugar reduces your body's immune response, and damages blood vessels, leading to increased risk of many diseases.     Heart Murmur A heart murmur is an extra sound that is caused by chaotic blood flow. The murmur can be heard as a "hum" or "whoosh" sound when blood flows through the heart. The heart has four areas called chambers. Valves separate the upper and lower chambers from each other (tricuspid valve and mitral valve) and separate the lower chambers of the heart from pathways that lead away from the heart (aortic valve and pulmonary valve). Normally, the valves open to let blood flow through or out of your heart, and then they shut to keep the blood from flowing backward. There are two types of heart murmurs:  Innocent murmurs. Most people with this type of heart murmur do not have a heart problem. Many children have innocent heart murmurs. Your health care provider may suggest some basic testing to find out whether your murmur is an innocent murmur. If an innocent heart murmur is found, there is no need for further tests or treatment and no need to restrict activities or stop playing sports.  Abnormal murmurs. These types of murmurs can occur in children and adults. Abnormal murmurs may be a sign of a more serious heart condition, such as a heart  defect present at birth (congenital defect) or heart valve disease.  What are the causes? This condition is caused by heart valves that are not working properly. In children, abnormal heart murmurs are typically caused by congenital defects. In adults, abnormal murmurs are usually from heart valve problems caused by disease, infection, or aging. Three types of heart valve defects can cause a murmur:  Regurgitation. This is when blood leaks back through the valve in the wrong direction.  Mitral valve prolapse. This is when the mitral valve of the heart has a loose flap and does not close tightly.  Stenosis. This is when a valve does not open enough and blocks blood flow.  This condition may also be caused by:  Pregnancy.  Fever.  Overactive thyroid gland.  Anemia.  Exercise.  Rapid growth spurts (in children).  What are the signs or symptoms? Innocent murmurs do not cause symptoms, and many people with abnormal murmurs may or may not have symptoms. If symptoms do develop, they may include:  Shortness of breath.  Blue coloring of the skin, especially on the fingertips.  Chest pain.  Palpitations, or feeling a fluttering or skipped heartbeat.  Fainting.  Persistent cough.  Getting tired much faster than expected.  Swelling in the abdomen, feet, or ankles.  How is this diagnosed? This condition may be diagnosed during a routine physical or other exam. If your health care provider hears a murmur with a stethoscope, he or she will listen for:  Where the murmur is located in your heart.  How long the murmur lasts (duration).  When the murmur is heard during the heartbeat.  How loud the murmur is. This may help the health care provider figure out what is causing the murmur.  You may be referred to a heart specialist (cardiologist). You may also have other tests, including:  Electrocardiogram (ECG or EKG). This test measures the electrical activity of your  heart.  Echocardiogram. This test uses high frequency sound waves to make pictures of your heart.  MRI or chest X-ray.  Cardiac catheterization. This test looks at blood flow through the heart.  For children and adults who have an abnormal heart murmur and want to stay active, it is important to complete testing, review test results, and receive recommendations from your health care provider. If heart disease is present, it may not be safe to play or be active. How is this treated? Heart murmurs themselves do not need treatment. In some cases, a heart murmur may go away on its own. If an underlying problem or disease is causing the murmur, you may need treatment. If treatment is needed, it will depend on the type and severity of the disease or heart problem causing the murmur. Treatment may include:  Medicine.  Surgery.  Dietary and lifestyle changes.  Follow these instructions at home:  Talk with your health care provider before participating in sports or other activities that require a lot of effort and energy (are strenuous).  Learn as much as possible about your condition and any related diseases. Ask your health care provider if you may at risk for any medical emergencies.  Talk with your health care provider about what symptoms you should look out for.  It is up to you to get your test results. Ask your health care provider, or the department that is doing the test, when your results will be ready.  Keep all follow-up visits as told by your health care provider. This is important. Contact a health care provider if:  You feel light-headed.  You are frequently short of breath.  You feel more tired than usual.  You are having a hard time keeping up with normal activities or fitness routines.  You have swelling in your ankles or feet.  You have chest pain.  You notice that your heart often beats irregularly.  You develop any new symptoms. Get help right away if:  You  develop severe chest pain.  You are having trouble breathing.  You have fainting spells.  Your symptoms suddenly get worse. These symptoms may represent a serious problem that is an emergency. Do not wait to see if the symptoms will go away. Get medical help right away. Call your local emergency services (911 in the [place] Do not drive yourself to the hospital. Summary  Normally, the heart valves open to let blood flow through or out of your heart, and then they shut to keep the blood from flowing backward.  Heart murmur is caused by heart valves that are not working properly.  You may need treatment if an underlying problem or disease is causing the heart murmur. Treatment may include medicine, surgery, or dietary and lifestyle changes.  Talk with your health care provider before participating in sports or other activities that require a lot of effort and energy (are strenuous).  Talk with your health care provider about what symptoms you should watch out for. This information is not intended to replace advice given to you  by your health care provider. Make sure you discuss any questions you have with your health care provider. Document Released: 09/15/2004 Document Revised: 07/27/2016 Document Reviewed: 07/27/2016 Elsevier Interactive Patient Education  2018 [employer].     Smoking Tobacco Information Smoking tobacco will very likely harm your health. Tobacco contains a poisonous (toxic), colorless chemical called nicotine. Nicotine affects the brain and makes tobacco addictive. This change in your brain can make it hard to stop smoking. Tobacco also has other toxic chemicals that can hurt your body and raise your risk of many cancers. How can smoking tobacco affect me? Smoking tobacco can increase your chances of having serious health conditions, such as:  Cancer. Smoking is most commonly associated with lung cancer, but can lead to cancer in other parts of the body.  Chronic  obstructive pulmonary disease (COPD). This is a long-term lung condition that makes it hard to breathe. It also gets worse over time.  High blood pressure (hypertension), heart disease, stroke, or heart attack.  Lung infections, such as pneumonia.  Cataracts. This is when the lenses in the eyes become clouded.  Digestive problems. This may include peptic ulcers, heartburn, and gastroesophageal reflux disease (GERD).  Oral health problems, such as gum disease and tooth loss.  Loss of taste and smell.  Smoking can affect your appearance by causing:  Wrinkles.  Yellow or stained teeth, fingers, and fingernails.  Smoking tobacco can also affect your social life.  Many workplaces, [employer], hotels, and public places are tobacco-free. This means that you may experience challenges in finding places to smoke when away from home.  The cost of a smoking habit can be expensive. Expenses for someone who smokes come in two ways: ? You spend money on a regular basis to buy tobacco. ? Your health care costs in the long-term are higher if you smoke.  Tobacco smoke can also affect the health of those around you. Children of smokers have greater chances of: ? Sudden infant death syndrome (SIDS). ? Ear infections. ? Lung infections.  What lifestyle changes can be made?  Do not start smoking. Quit if you already do.  To quit smoking: ? Make a plan to quit smoking and commit yourself to it. Look for programs to help you and ask your health care provider for recommendations and ideas. ? Talk with your health care provider about using nicotine replacement medicines to help you quit. Medicine replacement medicines include gum, lozenges, patches, sprays, or pills. ? Do not replace cigarette smoking with electronic cigarettes, which are commonly called e-cigarettes. The safety of e-cigarettes is not known, and some may contain harmful chemicals. ? Avoid places, people, or situations that tempt you  to smoke. ? If you try to quit but return to smoking, don't give up hope. It is very common for people to try a number of times before they fully succeed. When you feel ready again, give it another try.  Quitting smoking might affect the way you eat as well as your weight. Be prepared to monitor your eating habits. Get support in planning and following a healthy diet.  Ask your health care provider about having regular tests (screenings) to check for cancer. This may include blood tests, imaging tests, and other tests.  Exercise regularly. Consider taking walks, joining a gym, or doing yoga or exercise classes.  Develop skills to manage your stress. These skills include meditation. What are the benefits of quitting smoking? By quitting smoking, you may:  Lower your risk of  getting cancer and other diseases caused by smoking.  Live longer.  Breathe better.  Lower your blood pressure and heart rate.  Stop your addiction to tobacco.  Stop creating secondhand smoke that hurts other people.  Improve your sense of taste and smell.  Look better over time, due to having fewer wrinkles and less staining.  What can happen if changes are not made? If you do not stop smoking, you may:  Get cancer and other diseases.  Develop COPD or other long-term (chronic) lung conditions.  Develop serious problems with your heart and blood vessels (cardiovascular system).  Need more tests to screen for problems caused by smoking.  Have higher, long-term healthcare costs from medicines or treatments related to smoking.  Continue to have worsening changes in your lungs, mouth, and nose.  Where to find support: To get support to quit smoking, consider:  Asking your health care provider for more information and resources.  Taking classes to learn more about quitting smoking.  Looking for local organizations that offer resources about quitting smoking.  Joining a support group for people who  want to quit smoking in your local community.  Where to find more information: You may find more information about quitting smoking from:  HelpGuide.org: www.helpguide.org/articles/addictions/how-to-quit-smoking.htm  [employer]: smokefree.gov  American Lung Association: www.lung.org  Contact a health care provider if:  You have problems breathing.  Your lips, nose, or fingers turn blue.  You have chest pain.  You are coughing up blood.  You feel faint or you pass out.  You have other noticeable changes that cause you to worry. Summary  Smoking tobacco can negatively affect your health, the health of those around you, your finances, and your social life.  Do not start smoking. Quit if you already do. If you need help quitting, ask your health care provider.  Think about joining a support group for people who want to quit smoking in your local community. There are many effective programs that will help you to quit this behavior. This information is not intended to replace advice given to you by your health care provider. Make sure you discuss any questions you have with your health care provider. Document Released: 08/23/2016 Document Revised: 08/23/2016 Document Reviewed: 08/23/2016 Elsevier Interactive Patient Education  [employer].

## 2018-06-20 ENCOUNTER — Other Ambulatory Visit: Payer: Self-pay

## 2018-06-20 DIAGNOSIS — F988 Other specified behavioral and emotional disorders with onset usually occurring in childhood and adolescence: Secondary | ICD-10-CM

## 2018-06-20 NOTE — Telephone Encounter (Signed)
Adderall refill request. PMP checked, last filled on 05/11/18, #60, 42 day supply. Last office visit on 05/11/18, Next office visit on 10/25/18. In que for your review.

## 2018-06-21 MED ORDER — AMPHETAMINE-DEXTROAMPHETAMINE 30 MG PO TABS: ORAL_TABLET | ORAL | 0 refills | Status: DC

## 2018-07-30 ENCOUNTER — Other Ambulatory Visit: Payer: Self-pay

## 2018-07-30 DIAGNOSIS — F988 Other specified behavioral and emotional disorders with onset usually occurring in childhood and adolescence: Secondary | ICD-10-CM

## 2018-07-30 MED ORDER — AMPHETAMINE-DEXTROAMPHETAMINE 30 MG PO TABS: ORAL_TABLET | ORAL | 0 refills | Status: DC

## 2018-07-30 NOTE — Telephone Encounter (Signed)
Refill request for Adderall. PMP checked, last filled on 06/22/18, #60, 42 days. Next office visit on 10/25/18.

## 2018-08-06 DIAGNOSIS — R410 Disorientation, unspecified: Secondary | ICD-10-CM | POA: Diagnosis not present

## 2018-08-06 DIAGNOSIS — R0602 Shortness of breath: Secondary | ICD-10-CM | POA: Diagnosis not present

## 2018-08-06 DIAGNOSIS — R079 Chest pain, unspecified: Secondary | ICD-10-CM | POA: Diagnosis not present

## 2018-08-06 DIAGNOSIS — R29818 Other symptoms and signs involving the nervous system: Secondary | ICD-10-CM | POA: Diagnosis not present

## 2018-08-06 DIAGNOSIS — R072 Precordial pain: Secondary | ICD-10-CM | POA: Diagnosis not present

## 2018-08-06 DIAGNOSIS — Q231 Congenital insufficiency of aortic valve: Secondary | ICD-10-CM | POA: Diagnosis not present

## 2018-08-06 DIAGNOSIS — Z720 Tobacco use: Secondary | ICD-10-CM | POA: Diagnosis not present

## 2018-08-06 DIAGNOSIS — R55 Syncope and collapse: Secondary | ICD-10-CM | POA: Diagnosis not present

## 2018-08-06 DIAGNOSIS — R200 Anesthesia of skin: Secondary | ICD-10-CM | POA: Diagnosis not present

## 2018-08-06 DIAGNOSIS — R001 Bradycardia, unspecified: Secondary | ICD-10-CM | POA: Diagnosis not present

## 2018-08-10 ENCOUNTER — Ambulatory Visit: Payer: BLUE CROSS/BLUE SHIELD | Admitting: Adult Health Nurse Practitioner

## 2018-08-10 ENCOUNTER — Encounter: Payer: Self-pay | Admitting: Adult Health Nurse Practitioner

## 2018-08-10 VITALS — BP 130/80 | HR 78 | Temp 98.2°F | Ht 66.25 in | Wt 168.0 lb

## 2018-08-10 DIAGNOSIS — Z720 Tobacco use: Secondary | ICD-10-CM

## 2018-08-10 DIAGNOSIS — R072 Precordial pain: Secondary | ICD-10-CM

## 2018-08-10 DIAGNOSIS — I351 Nonrheumatic aortic (valve) insufficiency: Secondary | ICD-10-CM | POA: Diagnosis not present

## 2018-08-10 DIAGNOSIS — I10 Essential (primary) hypertension: Secondary | ICD-10-CM

## 2018-08-10 DIAGNOSIS — R55 Syncope and collapse: Secondary | ICD-10-CM

## 2018-08-10 DIAGNOSIS — F411 Generalized anxiety disorder: Secondary | ICD-10-CM

## 2018-08-10 NOTE — Patient Instructions (Addendum)
We will contact you for a Cardiology appointment, the referral was placed today.   We have also put in a Neurology referral for and EEG.  This is a test that checks the electrically activity of your brain.  This test was ordered because of the left sided weakness you experienced.  No strenuos activity at this time  Start taking 81mg  Aspirin daily.  Drink more water!      Go to hospital if you have any similar symptoms, please go to the emergency room.

## 2018-08-10 NOTE — Progress Notes (Addendum)
Follow up 96month  Assessment and Plan:  William Willis was seen today for hospitalization follow-up.  Diagnoses and all orders for this visit:  Aortic valve insufficiency, etiology of cardiac valve disease unspecified -     Ambulatory referral to Cardiology -     Ambulatory referral to Neurology -Start 81mg  ASA daily, discuss with cards . Precordial chest pain No episodes since hospitalization -     Ambulatory referral to Cardiology -No strenous activity until cardiology follow up.  Brief loss of consciousness No episodes since hospitalization -     Ambulatory referral to Neurology  Tobacco abuse -Discussed adverse effect of smoking and benefits of quitting. -smoking cessation counseling -Not ready to quit at this time -Will continue to assess readiness  Anxiety state Increased with recent hospitalization -Has family support Refill Rx Buspar Stress management techniques discussed, increase water, good sleep hygiene discussed, increase exercise, and increase veggies.   Discussed hospital precautions with patient at beginning and end of  Appointment.  Teach back with these precautions related to LOC or chest pain.  Follow up  In 3 months after referral completed OR PRN as needed.  All medications were reviewed with patient and family and fully reconciled. All questions answered fully, and patient was encouraged to call the office with any further questions or concerns. Discussed goal to avoid readmission related to this diagnosis.  There are no discontinued medications.  Over 40 minutes of exam, counseling, chart review, and complex, high/moderate level critical decision making was performed this visit.   Future Appointments  Date Time Provider Department Center  10/25/2018  3:00 PM William Gaudier, NP [facility] None     HPI 37 y.o.male presents for follow up for transition from recent hospital visit. Admit date to the hospital was 12/13/17, patient was discharged from the  hospital on 12/13/17 and our clinical staff contacted the office the day after discharge to set up a follow up appointment. The discharge summary, medications, and diagnostic test results were reviewed before meeting with the patient. The patient was admitted for:  chest pains and left sided numbness with newly diagnoses aortic regurgitation.   He has a heart murmur, since birth.  He has a stroke work up in the hospital and has a negative MRI and negative cardiac enzymes.  Echo showed 55-60% EF but moderate aortic regurgitation.    He was not prescribed any medications and recommendations to follow up with neurology for EEG and cardiology.  He reports some intermittent chest discomforts but nothing that has slowed him down.  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     Home health is not involved.   Images while in the hospital: XR CHEST PA AND LATERAL, 08/06/2018 10:39 AM  INDICATION:chest pain \ R07.2 Precordial chest pain  COMPARISON: None  FINDINGS:   Cardiovascular: Cardiac silhouette and pulmonary vasculature are within normal limits. Mediastinum: Within normal limits. Lungs/pleura: No focal airspace disease. No pleural effusion. No pneumothorax. Upper abdomen: Visualized portions are unremarkable. Chest wall/osseous structures: Unremarkable.  CONCLUSION:  No radiographic evidence of acute cardiopulmonary abnormality.   Transthoracic Echocardiogram Report  Name: William Willis Study Date: 08/06/2018 Height: 67 in MRN: [id number] Patient Location: B091 Weight: 170 lb DOB: 04/18/1981 Gender: Male BSA: 1.9 m2 Age: 15 yrs BP: 114/75 mmHg Reason For Study: Precordial chest pain HR: 50  Ordering Physician: [id number] William Willis Performed By: Claudette Stapler Referring Physician: Kingsley Willis - -  PROCEDURE  Image Quality: Fair. Injection of agitated saline contrast performed to  evaluate for possible  shunt. - SUMMARY The left ventricular size is normal. There is normal left ventricular wall thickness.  Left ventricular systolic function is normal. LV ejection fraction = 55-60%. The left ventricular wall motion is normal.  Left ventricular filling pattern is normal. The right ventricle is normal in size and function. A bicuspid aortic valve cannot be excluded. [dense calcification on the  commisural line of non and right coronary cusp] There is moderate aortic regurgitation. There was insufficient TR detected to calculate RV systolic pressure. The aortic root is normal size. The IVC is normal in size with an inspiratory collapse of greater then 50%,  suggesting normal right atrial pressure. There is no pericardial effusion. There is no comparison study available. - FINDINGS:  LEFT VENTRICLE The left ventricular size is normal. There is normal left ventricular wall  thickness. Left ventricular systolic function is normal. LV ejection fraction  = 55-60%. Left ventricular filling pattern is normal. The left ventricular  wall motion is normal. -  RIGHT VENTRICLE The right ventricle is normal in size and function.  LEFT ATRIUM The left atrial size is normal.  RIGHT ATRIUM  Right atrial size is normal. Injection of agitated saline showed no  right-to-left shunt. - AORTIC VALVE Focal calcification of the aortic valve. A bicuspid aortic valve cannot be  excluded. There is no aortic stenosis. There is moderate aortic regurgitation. - MITRAL VALVE The mitral valve is normal in structure and function. There is trace mitral  regurgitation. - TRICUSPID VALVE The tricuspid valve is normal in structure and function. There is trace  tricuspid regurgitation. There was insufficient TR detected to calculate RV  systolic pressure. - PULMONIC VALVE The pulmonic valve is not well visualized. Trace pulmonic valvular  regurgitation. - ARTERIES The aortic root is normal  size. - VENOUS Pulmonary venous flow pattern not well visualized. The IVC is normal in size  with an inspiratory collapse of greater then 50%, suggesting normal right  atrial pressure. - EFFUSION There is no pericardial effusion. - - MMode/2D Measurements & Calculations IVSd: 1.00 cm LVIDd: 5.1 cm LVPWd: 0.89 cm LVIDs: 2.5 cm LA diam: 2.5 cm EDV(MOD-sp4): 94.3 ml ESV(MOD-sp4): 29.9 ml Ao sinus diam: 3.5 cm LVOT diam: 2.4 cm SV(MOD-sp4): 64.4 ml SI(MOD-sp4): 34.1 ml/m2 IVC 1: 1.1 cm LA area A2: 16.8 cm2 LA area A4: 13.7 cm2 LA vol: 39.9 ml LA vol index: 21.1 ml/m2 RA area A4: 11.5 cm2 Doppler Measurements & Calculations MV E max vel: 78.6 cm/sec MV A max vel: 46.1 cm/sec MV E/A: 1.7 Med Peak E' Vel: 8.0 cm/sec Lat Peak E' Vel: 13.4 cm/sec E/Lat E`: 5.9 E/Med E`: 9.8 MV dec time: 0.18 sec SV(LVOT): 105.5 ml Ao V2 max: 195.0 cm/sec Ao max PG: 15.2 mmHg Ao V2 mean: 120.3 cm/sec Ao mean PG: 7.1 mmHg Ao V2 VTI: 39.3 cm AVA (VTI): 2.7 cm2 LV V1 VTI: 23.1 cm RAP systole: 3.0 mmHg AS Dimensionless Index (VTI): 0.59 AVAi(VTI) cm^2/m^2: 1.4 cm2 SV index(LVOT): 55.9 ml/m2   Reading Physician: Revonda HumphreyBharathi Upadhya, MD, [id number] 08/06/2018 05:13 PM   TEE: Left ventricular EF 55%  CT Head w/o contrast: Impression  1. No acute intracranial abnormality. If there is clinical concern for acute ischemia, MRI could further evaluate as CT is relatively insensitive for the detection of an acute infarct in the first 24 to 48 hours. 2. Remote right medial orbital wall fracture.   CTA Chest: Negative 1. No  evidence of acute pulmonary thromboembolic disease. 2. Mild bronchial wall thickening can be seen in the setting of airways disease such as bronchitis.  3. Small amount of mucus and/or debris in the trachea.  MR Angio neck w/o contrast: Unremarkable pre- and postcontrast MRA of the neck.   MRI MR BRAIN WO CONTRAST (Neurology FLAIR Protocol)  Narrative   Normal  noncontrast MRI of the brain.         Current Outpatient Medications (Other):  .  amphetamine-dextroamphetamine (ADDERALL) 30 MG tablet, Take 1/2 to 1or 2 tablets /day & please try to limit to 5 days /week (10 tab/week) to avoid addiction. .  busPIRone (BUSPAR) 15 MG tablet, Take 15 mg by mouth 3 (three) times daily. .  carbamazepine (EPITOL) 200 MG tablet, Take 1 tablet 3 x / day for Mood .  Cholecalciferol (VITAMIN D PO), Take 1,200 Units by mouth daily.  Past Medical History:  Diagnosis Date  . ADD (attention deficit disorder)   . Anxiety   . Pneumonia      No Known Allergies  ROS: all negative except above.   Physical Exam: Filed Weights   08/10/18 0908  Weight: 168 lb (76.2 kg)   BP 130/80   Pulse 78   Temp 98.2 F (36.8 C)   Ht 5' 6.25" (1.683 m)   Wt 168 lb (76.2 kg)   SpO2 97%   BMI 26.91 kg/m  General Appearance: Well nourished, in no apparent distress. Eyes: PERRLA, EOMs, conjunctiva no swelling or erythema Sinuses: No Frontal/maxillary tenderness ENT/Mouth: Ext aud canals clear, TMs without erythema, bulging. No erythema, swelling, or exudate on post pharynx.  Tonsils not swollen or erythematous. Hearing normal.  Neck: Supple, thyroid normal.  Respiratory: Respiratory effort normal, BS equal bilaterally without rales, rhonchi, wheezing or stridor.  Cardio: RRR with no MRGs. Brisk peripheral pulses without edema.  Abdomen: Soft, + BS.  Non tender, no guarding, rebound, hernias, masses. Lymphatics: Non tender without lymphadenopathy.  Musculoskeletal: Full ROM, 5/5 strength, normal gait.  Skin: Warm, dry without rashes, lesions, ecchymosis.  Neuro: Cranial nerves intact. Normal muscle tone, no cerebellar symptoms. Sensation intact.  Psych: Awake and oriented X 3, normal affect, Insight and Judgment appropriate.     Elder NegusKyra Rainier Feuerborn, NP 9:04 PM [facility]

## 2018-08-12 ENCOUNTER — Encounter: Payer: Self-pay | Admitting: Adult Health Nurse Practitioner

## 2018-09-10 ENCOUNTER — Other Ambulatory Visit: Payer: Self-pay

## 2018-09-10 DIAGNOSIS — F988 Other specified behavioral and emotional disorders with onset usually occurring in childhood and adolescence: Secondary | ICD-10-CM

## 2018-09-10 NOTE — Telephone Encounter (Signed)
Adderall refill request. PMP checked, last filled on 07/30/18, #60, 30 day supply.

## 2018-09-11 MED ORDER — AMPHETAMINE-DEXTROAMPHETAMINE 30 MG PO TABS: ORAL_TABLET | ORAL | 0 refills | Status: DC

## 2018-09-18 ENCOUNTER — Other Ambulatory Visit: Payer: Self-pay | Admitting: Internal Medicine

## 2018-09-18 DIAGNOSIS — F319 Bipolar disorder, unspecified: Secondary | ICD-10-CM

## 2018-09-18 DIAGNOSIS — F411 Generalized anxiety disorder: Secondary | ICD-10-CM

## 2018-09-23 NOTE — Addendum Note (Signed)
Addended byElder Negus A on: 09/23/2018 12:18 PM   Modules accepted: Level of Service

## 2018-10-15 ENCOUNTER — Encounter: Payer: Self-pay | Admitting: Cardiovascular Disease

## 2018-10-15 ENCOUNTER — Ambulatory Visit: Payer: BLUE CROSS/BLUE SHIELD | Admitting: Cardiovascular Disease

## 2018-10-15 VITALS — BP 118/80 | HR 59 | Ht 66.25 in | Wt 164.0 lb

## 2018-10-15 DIAGNOSIS — I351 Nonrheumatic aortic (valve) insufficiency: Secondary | ICD-10-CM | POA: Diagnosis not present

## 2018-10-15 DIAGNOSIS — R072 Precordial pain: Secondary | ICD-10-CM

## 2018-10-15 LAB — BASIC METABOLIC PANEL
BUN/Creatinine Ratio: 10 (ref 9–20)
BUN: 10 mg/dL (ref 6–20)
CO2: 24 mmol/L (ref 20–29)
CREATININE: 0.99 mg/dL (ref 0.76–1.27)
Calcium: 9.5 mg/dL (ref 8.7–10.2)
Chloride: 101 mmol/L (ref 96–106)
GFR, EST AFRICAN AMERICAN: 113 mL/min/{1.73_m2} (ref >59)
GFR, EST NON AFRICAN AMERICAN: 98 mL/min/{1.73_m2} (ref >59)
Glucose: 70 mg/dL (ref 65–99)
Potassium: 4.6 mmol/L (ref 3.5–5.2)
SODIUM: 139 mmol/L (ref 134–144)

## 2018-10-15 MED ORDER — METOPROLOL TARTRATE 25 MG PO TABS: ORAL_TABLET | ORAL | 0 refills | Status: DC

## 2018-10-15 NOTE — Progress Notes (Signed)
Chief Complaint  Patient presents with  . New Patient (Initial Visit)    History of Present Illness: 38 yo male with history of anxiety, ADD, chest pain, tobacco abuse, anxiety and aortic valve insufficiency here today as a new patient for the evaluation of aortic valve insufficiency. He was admitted to [facility] in December 2019 with c/o chest pain, left sided numbness and was found to have aortic valve insufficiency. Negative neurology workup. Brain MRI negative for stroke. Echo December 2019 at [facility] showed LVEF=55-60% with moderate aortic valve regurgitation. Cardiac enzymes negative.   He tells me today that he smokes two packs per day. He is very active. He has daily fatigue. He has chest pain several times per week. This is left sided and described as a pressure sensation with no associated dyspnea. He has frequent episodes of diaphoresis. He has no exertional chest pain. His left sided weakness has not recurred. No dizziness, near syncope, syncope or LE edema.   Primary Care Physician: Lucky Cowboy, MD  Past Medical History:  Diagnosis Date  . ADD (attention deficit disorder)   . Anxiety   . Aortic insufficiency   . Pneumonia   . Tobacco abuse     Past Surgical History:  Procedure Laterality Date  . ADENOIDECTOMY    . SHOULDER SURGERY Right 12-08-11  . TONSILLECTOMY    . WISDOM TOOTH EXTRACTION  10/19/2017    Current Outpatient Medications  Medication Sig Dispense Refill  . amphetamine-dextroamphetamine (ADDERALL) 30 MG tablet Take 1/2 to 1or 2 tablets /day & please try to limit to 5 days /week (10 tab/week) to avoid addiction. 60 tablet 0  . busPIRone (BUSPAR) 30 MG tablet Take 1/2 to 1 tablet 2 x /day as needed for Anxiety 180 tablet 0  . carbamazepine (TEGRETOL) 200 MG tablet Take 1 tablet 3 x / day for Mood 270 tablet 0  . Cholecalciferol (VITAMIN D PO) Take 1,200 Units by mouth daily.    . metoprolol tartrate (LOPRESSOR) 25 MG tablet Take one tablet by  mouth 2 hours prior to CT scan 1 tablet 0   No current facility-administered medications for this visit.     No Known Allergies  Social History   Socioeconomic History  . Marital status: Married    Spouse name: Not on file  . Number of children: 2  . Years of education: 10  . Highest education level: High school graduate  Occupational History  . Occupation: Works in Psychologist, forensic  Social Needs  . Financial resource strain: Not on file  . Food insecurity:    Worry: Not on file    Inability: Not on file  . Transportation needs:    Medical: Not on file    Non-medical: Not on file  Tobacco Use  . Smoking status: Current Every Day Smoker    Packs/day: 2.00    Years: 15.00    Pack years: 30.00    Types: Cigarettes  . Smokeless tobacco: Never Used  Substance and Sexual Activity  . Alcohol use: Yes    Comment: rare  . Drug use: Yes    Types: Marijuana  . Sexual activity: Yes    Partners: Female  Lifestyle  . Physical activity:    Days per week: Not on file    Minutes per session: Not on file  . Stress: Not on file  Relationships  . Social connections:    Talks on phone: Not on file    Gets together: Not on file  Attends religious service: Not on file    Active member of club or organization: Not on file    Attends meetings of clubs or organizations: Not on file    Relationship status: Not on file  . Intimate partner violence:    Fear of current or ex partner: Not on file    Emotionally abused: Not on file    Physically abused: Not on file    Forced sexual activity: Not on file  Other Topics Concern  . Not on file  Social History Narrative  . Not on file    Family History  Problem Relation Age of Onset  . Heart attack Maternal Grandfather        x11  . Stroke Maternal Grandfather        x2    Review of Systems:  As stated in the HPI and otherwise negative.   BP 118/80   Pulse (!) 59   Ht 5' 6.25" (1.683 m)   Wt 164 lb (74.4 kg)   SpO2 99%   BMI  26.27 kg/m   Physical Examination: General: Well developed, well nourished, NAD  HEENT: OP clear, mucus membranes moist  SKIN: warm, dry. No rashes. Neuro: No focal deficits  Musculoskeletal: Muscle strength 5/5 all ext  Psychiatric: Mood and affect normal  Neck: No JVD, no carotid bruits, no thyromegaly, no lymphadenopathy.  Lungs:Clear bilaterally, no wheezes, rhonci, crackles Cardiovascular: Regular rate and rhythm. Soft diastolic murmur.  Abdomen:Soft. Bowel sounds present. Non-tender.  Extremities: No lower extremity edema. Pulses are 2 + in the bilateral DP/PT.  EKG:  EKG is ordered today. The ekg ordered today demonstrates Sinus brady, rate 59 bpm.   Recent Labs: 10/24/2017: ALT 16; BUN 13; Creat 0.98; Hemoglobin 14.0; Platelets 244; Potassium 3.9; Sodium 140; TSH 1.27   Lipid Panel    Component Value Date/Time   CHOL 174 10/24/2017 1605   TRIG 100 10/24/2017 1605   HDL 44 10/24/2017 1605   CHOLHDL 4.0 10/24/2017 1605   LDLCALC 109 (H) 10/24/2017 1605     Wt Readings from Last 3 Encounters:  10/15/18 164 lb (74.4 kg)  08/10/18 168 lb (76.2 kg)  05/11/18 163 lb (73.9 kg)     Other studies Reviewed: Additional studies/ records that were reviewed today include: I reviewed all of the records from [facility] from December 2019 admission including chest CTA and echo reports.  Review of the above records demonstrates:   Assessment and Plan:   1. Chest pain: He has precordial chest pain that occurs mostly at rest. Heavy tobacco abuse and FH of CAD. Will arrange gated cardiac CTA to exclude CAD.   2. Aortic insufficiency: Echo in December 2019 at [facility] [place] exclude bicuspid aortic valve. AI is moderate per report. LV function is normal. Will repeat echo in 6 months.   Current medicines are reviewed at length with the patient today.  The patient does not have concerns regarding medicines.  The following changes have been made:  no change  Labs/ tests  ordered today include:   Orders Placed This Encounter  Procedures  . CT CORONARY MORPH W/CTA COR W/SCORE W/CA W/CM &/OR WO/CM  . CT CORONARY FRACTIONAL FLOW RESERVE DATA PREP  . CT CORONARY FRACTIONAL FLOW RESERVE FLUID ANALYSIS  . Basic Metabolic Panel (BMET)  . EKG 12-Lead  . ECHOCARDIOGRAM COMPLETE     Disposition:   FU with me in 6-8 months after echo.    Signed, Verne Carrow, MD 10/15/2018 9:37  AM    [facility] [place], [place], [place]  [address] Phone: [phone]; Fax: [phone]

## 2018-10-15 NOTE — Patient Instructions (Addendum)
Medication Instructions:  Your physician recommends that you continue on your current medications as directed. Please refer to the Current Medication list given to you today.  If you need a refill on your cardiac medications before your next appointment, please call your pharmacy.   Lab work: Lab work to be done today--BMP If you have labs (blood work) drawn today and your tests are completely normal, you will receive your results only by: Marland Kitchen MyChart Message (if you have MyChart) OR . A paper copy in the mail If you have any lab test that is abnormal or we need to change your treatment, we will call you to review the results.  Testing/Procedures: Your physician has requested that you have cardiac CT. Cardiac computed tomography (CT) is a painless test that uses an x-ray machine to take clear, detailed pictures of your heart. For further information please visit [email or web address] Please follow instruction sheet as given.   Your physician has requested that you have an echocardiogram. Echocardiography is a painless test that uses sound waves to create images of your heart. It provides your doctor with information about the size and shape of your heart and how well your heart's chambers and valves are working. This procedure takes approximately one hour. There are no restrictions for this procedure. To be done in August 2020    Follow-Up: At [facility], you and your health needs are our priority.  As part of our continuing mission to provide you with exceptional heart care, we have created designated Provider Care Teams.  These Care Teams include your primary Cardiologist (physician) and Advanced Practice Providers (APPs -  Physician Assistants and Nurse Practitioners) who all work together to provide you with the care you need, when you need it. You will need a follow up appointment in 6 months.  Please call our office 2 months in advance to schedule this appointment.  You may see  Verne Carrow, MD or one of the following Advanced Practice Providers on your designated Care Team:   [place], PA-C Ronie Spies, PA-C . Jacolyn Reedy, PA-C  Any Other Special Instructions Will Be Listed Below (If Applicable). Please arrive at the [facility] main entrance of [facility] at xx:xx AM (30-45 minutes prior to test start time)--we will call you to schedule this appointment  [facility] [address] [place], [place] [address] [phone]  Proceed to the [facility] Radiology Department (First Floor).  Please follow these instructions carefully (unless otherwise directed):  Hold all erectile dysfunction medications at least 48 hours prior to test.  On the Night Before the Test: . Be sure to Drink plenty of water. . Do not consume any caffeinated/decaffeinated beverages or chocolate 12 hours prior to your test. . Do not take any antihistamines 12 hours prior to your test.   On the Day of the Test: . Drink plenty of water. Do not drink any water within one hour of the test. . Do not eat any food 4 hours prior to the test. . You may take your regular medications prior to the test.  . Take metoprolol (Lopressor) two hours prior to test. Dose is 25 mg         After the Test: . Drink plenty of water. . After receiving IV contrast, you may experience a mild flushed feeling. This is normal. . On occasion, you may experience a mild rash up to 24 hours after the test. This is not dangerous. If this occurs, you can  take Benadryl 25 mg and increase your fluid intake. . If you experience trouble breathing, this can be serious. If it is severe call 911 IMMEDIATELY. If it is mild, please call our office. . If you take any of these medications: Glipizide/Metformin, Avandament, Glucavance, please do not take 48 hours after completing test.

## 2018-10-25 ENCOUNTER — Encounter: Payer: Self-pay | Admitting: Adult Health

## 2018-10-25 DIAGNOSIS — I351 Nonrheumatic aortic (valve) insufficiency: Secondary | ICD-10-CM | POA: Insufficient documentation

## 2018-10-25 NOTE — Patient Instructions (Addendum)
We will contact you in 1-2 days via MyChart with your lab results.    We will do a referral for Neurology related to the numbness/tingling in both of your arms.   Contact Dr [facility] for appointment regarding alopecia.   Follow up in 6 months  Call or return with new or worsening symptoms as discussed in appointment.  May contact office via phone [phone] or [facility].     Alopecia Areata, Adult  Alopecia areata is a condition that causes you to lose hair. You may lose hair on your scalp in patches. In some cases, you may lose all the hair on your scalp (alopecia totalis) or all the hair from your face and body (alopecia universalis). Alopecia areata is an autoimmune disease. This means that your body's defense system (immune system) mistakes normal parts of the body for germs or other things that can make you sick. When you have alopecia areata, the immune system attacks the hair follicles. Alopecia areata usually develops in childhood, but it can develop at any age. For some people, their hair grows back on its own and hair loss does not happen again. For others, their hair may fall out and grow back in cycles. The hair loss may last many years. Having this condition can be emotionally difficult, but it is not dangerous. What are the causes? The cause of this condition is not known. What increases the risk? This condition is more likely to develop in people who have:  A family history of alopecia.  A family history of another autoimmune disease, including type 1 diabetes and rheumatoid arthritis.  Asthma and allergies.  Down syndrome. What are the signs or symptoms? Round spots of patchy hair loss on the scalp is the main symptom of this condition. The spots may be mildly itchy. Other symptoms include:  Short dark hairs in the bald patches that are wider at the top (exclamation point hairs).  Dents, white spots, or lines in the fingernails or toenails.  Balding  and body hair loss. This is rare. How is this diagnosed? This condition is diagnosed based on your symptoms and family history. Your health care provider will also check your scalp skin, teeth, and nails. Your health care provider may refer you to a specialist in hair and skin disorders (dermatologist). You may also have tests, including:  A hair pull test.  Blood tests or other screening tests to check for autoimmune diseases, such as thyroid disease or diabetes.  Skin biopsy to confirm the diagnosis.  A procedure to examine the skin with a lighted magnifying instrument (dermoscopy). How is this treated? There is no cure for alopecia areata. Treatment is aimed at promoting the regrowth of hair and preventing the immune system from overreacting. No single treatment is right for all people with alopecia areata. It depends on the type of hair loss you have and how severe it is. Work with your health care provider to find the best treatment for you. Treatment may include:  Having regular checkups to make sure the condition is not getting worse (watchful waiting).  Steroid creams or pills for 6-8 weeks to stop the immune reaction and help hair to regrow more quickly.  Other topical medicines to alter the immune system response and support the hair growth cycle.  Steroid injections.  Therapy and counseling with a support group or therapist if you are having trouble coping with hair loss. Follow these instructions at home:  Learn as much as you can about  your condition.  Apply topical creams only as told by your health care provider.  Take over-the-counter and prescription medicines only as told by your health care provider.  Consider getting a wig or products to make hair look fuller or to cover bald spots, if you feel uncomfortable with your appearance.  Get therapy or counseling if you are having a hard time coping with hair loss. Ask your health care provider to recommend a counselor  or support group.  Keep all follow-up visits as told by your health care provider. This is important. Contact a health care provider if:  Your hair loss gets worse, even with treatment.  You have new symptoms.  You are struggling emotionally. Summary  Alopecia areata is an autoimmune condition that makes your body's defense system (immune system) attack the hair follicles. This causes you to lose hair.  Treatments may include regular checkups to make sure that the condition is not getting worse (watchful waiting), medicines, and steroid injections. This information is not intended to replace advice given to you by your health care provider. Make sure you discuss any questions you have with your health care provider. Document Released: 03/12/2004 Document Revised: 08/26/2016 Document Reviewed: 08/26/2016 Elsevier Interactive Patient Education  2019 [place] for Adults  A healthy lifestyle and preventive care can promote health and wellness. Preventive health guidelines for men include the following key practices:  A routine yearly physical is a good way to check with your health care provider about your health and preventative screening. It is a chance to share any concerns and updates on your health and to receive a thorough exam.  Visit your dentist for a routine exam and preventative care every 6 months. Brush your teeth twice a day and floss once a day. Good oral hygiene prevents tooth decay and gum disease.  The frequency of eye exams is based on your age, health, family medical history, use of contact lenses, and other factors. Follow your health care provider's recommendations for frequency of eye exams.  Eat a healthy diet. Foods such as vegetables, fruits, whole grains, low-fat dairy products, and lean protein foods contain the nutrients you need without too many calories. Decrease your intake of foods high in solid fats, added sugars, and salt. Eat  the right amount of calories for you.Get information about a proper diet from your health care provider, if necessary.  Regular physical exercise is one of the most important things you can do for your health. Most adults should get at least 150 minutes of moderate-intensity exercise (any activity that increases your heart rate and causes you to sweat) each week. In addition, most adults need muscle-strengthening exercises on 2 or more days a week.  Maintain a healthy weight. The body mass index (BMI) is a screening tool to identify possible weight problems. It provides an estimate of body fat based on height and weight. Your health care provider can find your BMI and can help you achieve or maintain a healthy weight.For adults 20 years and older:  A BMI below 18.5 is considered underweight.  A BMI of 18.5 to 24.9 is normal.  A BMI of 25 to 29.9 is considered overweight.  A BMI of 30 and above is considered obese.  Maintain normal blood lipids and cholesterol levels by exercising and minimizing your intake of saturated fat. Eat a balanced diet with plenty of fruit and vegetables. Blood tests for lipids and cholesterol should begin at age 33  and be repeated every 5 years. If your lipid or cholesterol levels are high, you are over 50, or you are at high risk for heart disease, you may need your cholesterol levels checked more frequently.Ongoing high lipid and cholesterol levels should be treated with medicines if diet and exercise are not working.  If you smoke, find out from your health care provider how to quit. If you do not use tobacco, do not start.  Lung cancer screening is recommended for adults aged 6-80 years who are at high risk for developing lung cancer because of a history of smoking. A yearly low-dose CT scan of the lungs is recommended for people who have at least a 30-pack-year history of smoking and are a current smoker or have quit within the past 15 years. A pack year of  smoking is smoking an average of 1 pack of cigarettes a day for 1 year (for example: 1 pack a day for 30 years or 2 packs a day for 15 years). Yearly screening should continue until the smoker has stopped smoking for at least 15 years. Yearly screening should be stopped for people who develop a health problem that would prevent them from having lung cancer treatment.  If you choose to drink alcohol, do not have more than 2 drinks per day. One drink is considered to be 12 ounces (355 mL) of beer, 5 ounces (148 mL) of wine, or 1.5 ounces (44 mL) of liquor.  High blood pressure causes heart disease and increases the risk of stroke. Your blood pressure should be checked. Ongoing high blood pressure should be treated with medicines, if weight loss and exercise are not effective.  If you are 59-86 years old, ask your health care provider if you should take aspirin to prevent heart disease.  Diabetes screening involves taking a blood sample to check your fasting blood sugar level. Testing should be considered at a younger age or be carried out more frequently if you are overweight and have at least 1 risk factor for diabetes.  Colorectal cancer can be detected and often prevented. Most routine colorectal cancer screening begins at the age of 42 and continues through age 87. However, your health care provider may recommend screening at an earlier age if you have risk factors for colon cancer. On a yearly basis, your health care provider may provide home test kits to check for hidden blood in the stool. Use of a small camera at the end of a tube to directly examine the colon (sigmoidoscopy or colonoscopy) can detect the earliest forms of colorectal cancer. Talk to your health care provider about this at age 61, when routine screening begins. Direct exam of the colon should be repeated every 5-10 years through age 95, unless early forms of precancerous polyps or small growths are found.  Screening for abdominal  aortic aneurysm (AAA)  are recommended for persons over age 80 who have history of hypertensionor who are current or former smokers.  Talk with your health care provider about prostate cancer screening.  Testicular cancer screening is recommended for adult males. Screening includes self-exam, a health care provider exam, and other screening tests. Consult with your health care provider about any symptoms you have or any concerns you have about testicular cancer.  Use sunscreen. Apply sunscreen liberally and repeatedly throughout the day. You should seek shade when your shadow is shorter than you. Protect yourself by wearing long sleeves, pants, a wide-brimmed hat, and sunglasses year round, whenever you are outdoors.  Once a month, do a whole-body skin exam, using a mirror to look at the skin on your back. Tell your health care provider about new moles, moles that have irregular borders, moles that are larger than a pencil eraser, or moles that have changed in shape or color.  Stay current with required vaccines (immunizations).  Influenza vaccine. All adults should be immunized every year.  Tetanus, diphtheria, and acellular pertussis (Td, Tdap) vaccine. An adult who has not previously received Tdap or who does not know his vaccine status should receive 1 dose of Tdap. This initial dose should be followed by tetanus and diphtheria toxoids (Td) booster doses every 10 years. Adults with an unknown or incomplete history of completing a 3-dose immunization series with Td-containing vaccines should begin or complete a primary immunization series including a Tdap dose. Adults should receive a Td booster every 10 years.  Zoster vaccine. One dose is recommended for adults aged 75 years or older unless certain conditions are present.    Pneumococcal 13-valent conjugate (PCV13) vaccine. When indicated, a person who is uncertain of his immunization history and has no record of immunization should receive  the PCV13 vaccine. An adult aged 52 years or older who has certain medical conditions and has not been previously immunized should receive 1 dose of PCV13 vaccine. This PCV13 should be followed with a dose of pneumococcal polysaccharide (PPSV23) vaccine. The PPSV23 vaccine dose should be obtained at least 8 weeks after the dose of PCV13 vaccine. An adult aged 80 years or older who has certain medical conditions and previously received 1 or more doses of PPSV23 vaccine should receive 1 dose of PCV13. The PCV13 vaccine dose should be obtained 1 or more years after the last PPSV23 vaccine dose.    Pneumococcal polysaccharide (PPSV23) vaccine. When PCV13 is also indicated, PCV13 should be obtained first. All adults aged 36 years and older should be immunized. An adult younger than age 61 years who has certain medical conditions should be immunized. Any person who resides in a nursing home or long-term care facility should be immunized. An adult smoker should be immunized. People with an immunocompromised condition and certain other conditions should receive both PCV13 and PPSV23 vaccines. People with human immunodeficiency virus (HIV) infection should be immunized as soon as possible after diagnosis. Immunization during chemotherapy or radiation therapy should be avoided. Routine use of PPSV23 vaccine is not recommended for American Indians, [place] Natives, or people younger than 65 years unless there are medical conditions that require PPSV23 vaccine. When indicated, people who have unknown immunization and have no record of immunization should receive PPSV23 vaccine. One-time revaccination 5 years after the first dose of PPSV23 is recommended for people aged 19-64 years who have chronic kidney failure, nephrotic syndrome, asplenia, or immunocompromised conditions. People who received 1-2 doses of PPSV23 before age 21 years should receive another dose of PPSV23 vaccine at age 45 years or later if at least 5 years  have passed since the previous dose. Doses of PPSV23 are not needed for people immunized with PPSV23 at or after age 38 years.  Hepatitis A vaccine. Adults who wish to be protected from this disease, have certain high-risk conditions, work with hepatitis A-infected animals, work in hepatitis A research labs, or travel to or work in countries with a high rate of hepatitis A should be immunized. Adults who were previously unvaccinated and who anticipate close contact with an international adoptee during the first 60 days after arrival in the [place]  States from a country with a high rate of hepatitis A should be immunized.  Hepatitis B vaccine. Adults should be immunized if they wish to be protected from this disease, have certain high-risk conditions, may be exposed to blood or other infectious body fluids, are household contacts or sex partners of hepatitis B positive people, are clients or workers in certain care facilities, or travel to or work in countries with a high rate of hepatitis B.  Preventive Service / Frequency  Ages 40 to 36  Blood pressure check.  Lipid and cholesterol check.  Hepatitis C blood test.** / For any individual with known risks for hepatitis C.  Skin self-exam. / Monthly.  Influenza vaccine. / Every year.  Tetanus, diphtheria, and acellular pertussis (Tdap, Td) vaccine.** / Consult your health care provider. 1 dose of Td every 10 years.  HPV vaccine. / 3 doses over 6 months, if 69 or younger.  Measles, mumps, rubella (MMR) vaccine.** / You need at least 1 dose of MMR if you were born in 1957 or later. You may also need a second dose.  Pneumococcal 13-valent conjugate (PCV13) vaccine.** / Consult your health care provider.  Pneumococcal polysaccharide (PPSV23) vaccine.** / 1 to 2 doses if you smoke cigarettes or if you have certain conditions.  Meningococcal vaccine.** / 1 dose if you are age 39 to 53 years and a Market researcher living in a residence  hall, or have one of several medical conditions. You may also need additional booster doses.  Hepatitis A vaccine.** / Consult your health care provider.  Hepatitis B vaccine.** / Consult your health care provider. +++++++++ Recommend Adult Low Dose Aspirin or  coated  Aspirin 81 mg daily  To reduce risk of Colon Cancer 20 %,  Skin Cancer 26 % ,  Melanoma 46%  and  Pancreatic cancer 60% ++++++++++++++++++ Vitamin D goal  is between 70-100.  Please make sure that you are taking your Vitamin D as directed.  It is very important as a natural anti-inflammatory  helping hair, skin, and nails, as well as reducing stroke and heart attack risk.  It helps your bones and helps with mood. It also decreases numerous cancer risks so please take it as directed.  Low Vit D is associated with a 200-300% higher risk for CANCER  and 200-300% higher risk for HEART   ATTACK  &  STROKE.   .....................................Marland Kitchen It is also associated with higher death rate at younger ages,  autoimmune diseases like Rheumatoid arthritis, Lupus, Multiple Sclerosis.    Also many other serious conditions, like depression, Alzheimer's Dementia, infertility, muscle aches, fatigue, fibromyalgia - just to name a few. +++++++++++++++++++++ Recommend the book "The END of DIETING" by Dr Excell Seltzer  & the book "The END of DIABETES " by Dr Excell Seltzer At [email or web address] - get book & Audio CD's    Being diabetic has a  300% increased risk for heart attack, stroke, cancer, and alzheimer- type vascular dementia. It is very important that you work harder with diet by avoiding all foods that are white. Avoid white rice (brown & wild rice is OK), white potatoes (sweetpotatoes in moderation is OK), White bread or wheat bread or anything made out of white flour like bagels, donuts, rolls, buns, biscuits, cakes, pastries, cookies, pizza crust, and pasta (made from white flour & egg whites) - vegetarian pasta or spinach or wheat  pasta is OK. Multigrain breads like Arnold's or Pepperidge [employer], or multigrain sandwich thins or flatbreads.  Diet, exercise and weight loss can reverse and cure diabetes in the early stages.  Diet, exercise and weight loss is very important in the control and prevention of complications of diabetes which affects every system in your body, ie. Brain - dementia/stroke, eyes - glaucoma/blindness, heart - heart attack/heart failure, kidneys - dialysis, stomach - gastric paralysis, intestines - malabsorption, nerves - severe painful neuritis, circulation - gangrene & loss of a leg(s), and finally cancer and Alzheimers.    I recommend avoid fried & greasy foods,  sweets/candy, white rice (brown or wild rice or Quinoa is OK), white potatoes (sweet potatoes are OK) - anything made from white flour - bagels, doughnuts, rolls, buns, biscuits,white and wheat breads, pizza crust and traditional pasta made of white flour & egg white(vegetarian pasta or spinach or wheat pasta is OK).  Multi-grain bread is OK - like multi-grain flat bread or sandwich thins. Avoid alcohol in excess. Exercise is also important.    Eat all the vegetables you want - avoid meat, especially red meat and dairy - especially cheese.  Cheese is the most concentrated form of trans-fats which is the worst thing to clog up our arteries. Veggie cheese is OK which can be found in the fresh produce section at [employer] or [employer] or [employer]  +++++++++++++++++++ DASH Eating Plan  DASH stands for "Dietary Approaches to Stop Hypertension."   The DASH eating plan is a healthy eating plan that has been shown to reduce high blood pressure (hypertension). Additional health benefits may include reducing the risk of type 2 diabetes mellitus, heart disease, and stroke. The DASH eating plan may also help with weight loss. WHAT DO I NEED TO KNOW ABOUT THE DASH EATING PLAN? For the DASH eating plan, you will follow these general guidelines:  Choose  foods with a percent daily value for sodium of less than 5% (as listed on the food label).  Use salt-free seasonings or herbs instead of table salt or sea salt.  Check with your health care provider or pharmacist before using salt substitutes.  Eat lower-sodium products, often labeled as "lower sodium" or "no salt added."  Eat fresh foods.  Eat more vegetables, fruits, and low-fat dairy products.  Choose whole grains. Look for the word "whole" as the first word in the ingredient list.  Choose fish   Limit sweets, desserts, sugars, and sugary drinks.  Choose heart-healthy fats.  Eat veggie cheese   Eat more home-cooked food and less restaurant, buffet, and fast food.  Limit fried foods.  Cook foods using methods other than frying.  Limit canned vegetables. If you do use them, rinse them well to decrease the sodium.  When eating at a restaurant, ask that your food be prepared with less salt, or no salt if possible.                      WHAT FOODS CAN I EAT? Read Dr Fara Olden Fuhrman's books on The End of Dieting & The End of Diabetes  Grains Whole grain or whole wheat bread. Brown rice. Whole grain or whole wheat pasta. Quinoa, bulgur, and whole grain cereals. Low-sodium cereals. Corn or whole wheat flour tortillas. Whole grain cornbread. Whole grain crackers. Low-sodium crackers.  Vegetables Fresh or frozen vegetables (raw, steamed, roasted, or grilled). Low-sodium or reduced-sodium tomato and vegetable juices. Low-sodium or reduced-sodium tomato sauce and paste. Low-sodium or reduced-sodium canned vegetables.   Fruits All fresh, canned (in natural juice), or frozen fruits.  Protein  Products  All fish and seafood.  Dried beans, peas, or lentils. Unsalted nuts and seeds. Unsalted canned beans.  Dairy Low-fat dairy products, such as skim or 1% milk, 2% or reduced-fat cheeses, low-fat ricotta or cottage cheese, or plain low-fat yogurt. Low-sodium or reduced-sodium  cheeses.  Fats and Oils Tub margarines without trans fats. Light or reduced-fat mayonnaise and salad dressings (reduced sodium). Avocado. Safflower, olive, or canola oils. Natural peanut or almond butter.  Other Unsalted popcorn and pretzels. The items listed above may not be a complete list of recommended foods or beverages. Contact your dietitian for more options.  +++++++++++++++++++  WHAT FOODS ARE NOT RECOMMENDED? Grains/ White flour or wheat flour White bread. White pasta. White rice. Refined cornbread. Bagels and croissants. Crackers that contain trans fat.  Vegetables  Creamed or fried vegetables. Vegetables in a . Regular canned vegetables. Regular canned tomato sauce and paste. Regular tomato and vegetable juices.  Fruits Dried fruits. Canned fruit in light or heavy syrup. Fruit juice.  Meat and Other Protein Products Meat in general - RED meat & White meat.  Fatty cuts of meat. Ribs, chicken wings, all processed meats as bacon, sausage, bologna, salami, fatback, hot dogs, bratwurst and packaged luncheon meats.  Dairy Whole or 2% milk, cream, half-and-half, and cream cheese. Whole-fat or sweetened yogurt. Full-fat cheeses or blue cheese. Non-dairy creamers and whipped toppings. Processed cheese, cheese spreads, or cheese curds.  Condiments Onion and garlic salt, seasoned salt, table salt, and sea salt. Canned and packaged gravies. Worcestershire sauce. Tartar sauce. Barbecue sauce. Teriyaki sauce. Soy sauce, including reduced sodium. Steak sauce. Fish sauce. Oyster sauce. Cocktail sauce. Horseradish. Ketchup and mustard. Meat flavorings and tenderizers. Bouillon cubes. Hot sauce. Tabasco sauce. Marinades. Taco seasonings. Relishes.  Fats and Oils Butter, stick margarine, lard, shortening and bacon fat. Coconut, palm kernel, or palm oils. Regular salad dressings.  Pickles and olives. Salted popcorn and pretzels.  The items listed above may not be a complete list of foods  and beverages to avoid.          [place]  [employer]  [phone] 699-4750 for more information or for a free program for smoking cessation help.   You can call QUIT SMART 1-800-QUIT-NOW for free nicotine patches or replacement therapy- if they are out- keep calling  [facility] Can call for smoking cessation classes, [phone]  If you have a smart phone, please look up Smoke Free app, this will help you stay on track and give you information about money you have saved, life that you have gained back and a ton of more information.   We are giving you chantix for smoking cessation. You can do it! And we are here to help! You may have heard some scary side effects about chantix, the three most common I hear about are nausea, crazy dreams and depression.  However, I like for my patients to try to stay on 1/2 a tablet twice a day rather than one tablet twice a day as normally prescribed. This helps decrease the chances of side effects and helps save money by making a one month prescription last two months  Please start the prescription this way:  Start 1/2 tablet by mouth once daily after food with a full glass of water for 3 days Then do 1/2 tablet by mouth twice daily for 4 days. During this first week you can smoke, but try to stop after this week.  At this point we have  several options: 1) continue on 1/2 tablet twice a day- which I encourage you to do. You can stay on this dose the rest of the time on the medication or if you still feel the need to smoke you can do one of the two options below. 2) do one tablet in the morning and 1/2 in the evening which helps decrease dreams. 3) do one tablet twice a day.   What if I miss a dose? If you miss a dose, take it as soon as you can. If it is almost time for your next dose, take only that dose. Do not take double or extra doses.  What should I watch for while using this medicine? Visit  your doctor or health care professional for regular check ups. Ask for ongoing advice and encouragement from your doctor or healthcare professional, friends, and family to help you quit. If you smoke while on this medication, quit again  Your mouth may get dry. Chewing sugarless gum or hard candy, and drinking plenty of water may help. Contact your doctor if the problem does not go away or is severe.  You may get drowsy or dizzy. Do not drive, use machinery, or do anything that needs mental alertness until you know how this medicine affects you. Do not stand or sit up quickly, especially if you are an older patient.   The use of this medicine may increase the chance of suicidal thoughts or actions. Pay special attention to how you are responding while on this medicine. Any worsening of mood, or thoughts of suicide or dying should be reported to your health care professional right away.  ADVANTAGES OF QUITTING SMOKING  Within 20 minutes, blood pressure decreases. Your pulse is at normal level.  After 8 hours, carbon monoxide levels in the blood return to normal. Your oxygen level increases.  After 24 hours, the chance of having a heart attack starts to decrease. Your breath, hair, and body stop smelling like smoke.  After 48 hours, damaged nerve endings begin to recover. Your sense of taste and smell improve.  After 72 hours, the body is virtually free of nicotine. Your bronchial tubes relax and breathing becomes easier.  After 2 to 12 weeks, lungs can hold more air. Exercise becomes easier and circulation improves.  After 1 year, the risk of coronary heart disease is cut in half.  After 5 years, the risk of stroke falls to the same as a nonsmoker.  After 10 years, the risk of lung cancer is cut in half and the risk of other cancers decreases significantly.  After 15 years, the risk of coronary heart disease drops, usually to the level of a nonsmoker.  You will have extra money to spend  on things other than cigarettes.

## 2018-10-25 NOTE — Progress Notes (Signed)
Complete Physical  Assessment and Plan:  William Willis was seen today for annual exam.  Diagnoses and all orders for this visit: Encounter for annual physical exam Yearly  Attention deficit disorder (ADD) without hyperactivity Doing well on current regiment, continue with benefit Refill sent electronically.  Aortic valve insufficiency, etiology of cardiac valve disease unspecified Patient scheduled for CTA of chest from Cardiology evaluation  Pure hypertriglyceridemia Discussed dietary and exercise modificaitons -     Lipid panel  Numbness and tingling of both upper extremities Discussed monitoring symptoms Has been a chronic issue for him Will do referral to Neurology related to this.  Alopecia Isolated to back of neck. Discussed dermatology Should not need referral, contact Dr [facility] in [place]. Will contact office if referral is needed.  History of smoking 30 or more pack years Discussed this at length Not ready to quit Down to 2 packs a day Will continue to assess readiness  Anxiety state Doing well on current regiment Continue with benefit  Vitamin D deficiency Continue supplementation -     VITAMIN D 25 Hydroxy (Vit-D Deficiency)  Overweight (BMI 25.0-29.9)  Discussed dietary and exrcise modifications -     Hemoglobin A1c -     Insulin, random  Medication management -     VITAMIN D 25 Hydroxy (Vit-D Deficiency, Fractures) -     Lipid panel -     TSH -     Magnesium -     CBC with Differential/Platelet -     Hemoglobin A1c -     Insulin, random -     Microalbumin / creatinine urine ratio     Discussed med's effects and SE's. Screening labs and tests as requested with regular follow-up as recommended. Over 40 minutes of exam, counseling, chart review and critical decision making was performed  HPI Patient presents for a complete physical.  He recently had a cardiac evaluation by Dr Clifton James related to episode of chest pain and left side  numbess in December of 2019.  Hospital work up Echo 55-60% with mod aortic valve regurgitation, cardiac enzymes and Neurology workup negative.  He has a CTA scheduled related to heavy tobacco use and FH of CAD.  In six months a repeat ECHO.  No medications changes.  Reports that he has numbness and tingling in bilateral upper extremities.  He reports it varies in occurrence and unable to link to specific activity.  The duration is 10-86min.  Reports he tried shaking or using his arm and he does put an arm above his head resting on it and it helps some.  He also reports a burning napping in his right elbow.  He has history of  Tendonitis and has worn an OTC brace that provies minimal relief.      Reports that his alopecia has returned.  He reports when he was in 5th grade he received injections and steroid creams for this.  He does not follow with a dermatologist regularly.  He her periods where he will have small patches and they will grow back.  Now he has a spot at the base of his neck that continues to get larger.  He would like a referal for this today.  His blood pressure has been controlled at home, today their BP is BP: 140/90. He does workout. He denies chest pain, shortness of breath, dizziness.  He is not on cholesterol medication and denies myalgias. His cholesterol is not at goal. The cholesterol last visit was:   Lab  Results  Component Value Date   CHOL 174 10/24/2017   HDL 44 10/24/2017   LDLCALC 109 (H) 10/24/2017   TRIG 100 10/24/2017   CHOLHDL 4.0 10/24/2017   He  has been working on diet and exercise for prediabetes, he is not on bASA, he is not on ACE/ARB and denies hyperglycemia, hypoglycemia , nausea, polydipsia and polyuria. Last A1C in the office was:  Lab Results  Component Value Date   HGBA1C 5.0 10/24/2017   Last GFR:   Lab Results  Component Value Date   GFRNONAA 98 10/15/2018     Lab Results  Component Value Date   GFRAA 113 10/15/2018   Patient is on  Vitamin D supplement.   Lab Results  Component Value Date   VD25OH 33 10/24/2017      Current Medications:  Current Outpatient Medications on File Prior to Visit  Medication Sig Dispense Refill  . amphetamine-dextroamphetamine (ADDERALL) 30 MG tablet Take 1/2 to 1or 2 tablets /day & please try to limit to 5 days /week (10 tab/week) to avoid addiction. 60 tablet 0  . busPIRone (BUSPAR) 30 MG tablet Take 1/2 to 1 tablet 2 x /day as needed for Anxiety 180 tablet 0  . carbamazepine (TEGRETOL) 200 MG tablet Take 1 tablet 3 x / day for Mood 270 tablet 0  . Cholecalciferol (VITAMIN D PO) Take 1,200 Units by mouth daily.    . metoprolol tartrate (LOPRESSOR) 25 MG tablet Take one tablet by mouth 2 hours prior to CT scan (Patient not taking: Reported on 10/26/2018) 1 tablet 0   No current facility-administered medications on file prior to visit.    Allergies:  No Known Allergies Health Maintenance:  Immunization History  Administered Date(s) Administered  . Tdap 02/08/2015    Tdap: 01/2018 Pneumovax: N/A Prevnar 13: N/A Flu vaccine: Declines Zostavax: N/A  Eye Exam: Due Dentist: Scheduled April 2020  Patient Care Team: Lucky Cowboy, MD as PCP - General (Internal Medicine) Kathleene Hazel, MD as PCP - Cardiology (Cardiology)  Medical History:  has ADD (attention deficit disorder); Vitamin D deficiency; Anxiety state; Dysthymia; Acute right-sided low back pain without sciatica; Medication management; History of smoking 30 or more pack years; Hyperlipidemia; Overweight (BMI 25.0-29.9); and Aortic insufficiency on their problem list. Surgical History:  He  has a past surgical history that includes Shoulder surgery (Right, 12-08-11); Tonsillectomy; Adenoidectomy; and Wisdom tooth extraction (10/19/2017). Family History:  His family history includes Heart attack in his maternal grandfather; Stroke in his maternal grandfather. Social History:   reports that he has been smoking  cigarettes. He has a 30.00 pack-year smoking history. He has never used smokeless tobacco. He reports current alcohol use. He reports current drug use. Drug: Marijuana. Review of Systems:  Review of Systems  Constitutional: Negative for chills, diaphoresis, fever, malaise/fatigue and weight loss.  HENT: Negative for congestion, ear discharge, ear pain, hearing loss, nosebleeds, sinus pain, sore throat and tinnitus.   Eyes: Negative for blurred vision, double vision, photophobia, pain, discharge and redness.  Respiratory: Positive for cough. Negative for hemoptysis, sputum production, shortness of breath, wheezing and stridor.        Chronic  Cardiovascular: Negative for chest pain, palpitations, orthopnea, claudication, leg swelling and PND.  Gastrointestinal: Negative for abdominal pain, blood in stool, constipation, diarrhea, heartburn, melena, nausea and vomiting.  Genitourinary: Negative for dysuria, flank pain, frequency, hematuria and urgency.  Musculoskeletal: Positive for back pain. Negative for falls, joint pain, myalgias and neck pain.  Skin: Negative  for itching and rash.  Neurological: Positive for tingling. Negative for dizziness, tremors, sensory change, speech change, focal weakness, seizures, loss of consciousness, weakness and headaches.       Reports his wife said he has started snoring suddenly. He is unaware of this.  Endo/Heme/Allergies: Negative for environmental allergies and polydipsia. Does not bruise/bleed easily.  Psychiatric/Behavioral: Negative for depression, hallucinations, memory loss, substance abuse and suicidal ideas. The patient is nervous/anxious. The patient does not have insomnia.     Physical Exam: Estimated body mass index is 25.98 kg/m as calculated from the following:   Height as of this encounter: 5' 6.5" (1.689 m).   Weight as of this encounter: 163 lb 6.4 oz (74.1 kg). BP 140/90   Pulse 82   Temp 98.1 F (36.7 C)   Ht 5' 6.5" (1.689 m)   Wt  163 lb 6.4 oz (74.1 kg)   SpO2 97%   BMI 25.98 kg/m  General Appearance: Well nourished, in no apparent distress.  Eyes: PERRLA, EOMs, conjunctiva no swelling or erythema, normal fundi and vessels.  Sinuses: No Frontal/maxillary tenderness  ENT/Mouth: Ext aud canals clear, normal light reflex with TMs without erythema, bulging. Good dentition. No erythema, swelling, or exudate on post pharynx. Tonsils not swollen or erythematous. Hearing normal.  Neck: Supple, thyroid normal. No bruits  Respiratory: Respiratory effort normal, BS equal bilaterally with rhonchi and decreased breath sounds at bases. No rales, wheezing or stridor.  Cardio: RRR .  Soft diastolic murmur. No rubs or gallops. Brisk peripheral pulses without edema.  Chest: symmetric, with normal excursions and percussion.  Abdomen: Soft, nontender, no guarding, rebound, hernias, masses, or organomegaly.  Lymphatics: Non tender without lymphadenopathy.  Genitourinary:  Musculoskeletal: Full ROM all peripheral extremities,5/5 strength, and normal gait. Mild thoorasic scoliosis . Skin: Warm, dry without rashes, lesions, ecchymosis. Half dollar size area of alopecia at base of neck. Neuro: Cranial nerves intact, reflexes equal bilaterally. Normal muscle tone, no cerebellar symptoms. Sensation intact.  Psych: Awake and oriented X 3, normal affect, Insight and Judgment appropriate.   EKG: Defer, just had Cardiology appointment.   Caileigh Canche 10:20 AM [facility]

## 2018-10-26 ENCOUNTER — Encounter: Payer: Self-pay | Admitting: Adult Health Nurse Practitioner

## 2018-10-26 ENCOUNTER — Ambulatory Visit: Payer: BLUE CROSS/BLUE SHIELD | Admitting: Adult Health Nurse Practitioner

## 2018-10-26 VITALS — BP 140/90 | HR 82 | Temp 98.1°F | Ht 66.5 in | Wt 163.4 lb

## 2018-10-26 DIAGNOSIS — Z87891 Personal history of nicotine dependence: Secondary | ICD-10-CM

## 2018-10-26 DIAGNOSIS — E559 Vitamin D deficiency, unspecified: Secondary | ICD-10-CM

## 2018-10-26 DIAGNOSIS — Z1329 Encounter for screening for other suspected endocrine disorder: Secondary | ICD-10-CM | POA: Diagnosis not present

## 2018-10-26 DIAGNOSIS — R202 Paresthesia of skin: Secondary | ICD-10-CM

## 2018-10-26 DIAGNOSIS — Z79899 Other long term (current) drug therapy: Secondary | ICD-10-CM | POA: Diagnosis not present

## 2018-10-26 DIAGNOSIS — Z1389 Encounter for screening for other disorder: Secondary | ICD-10-CM

## 2018-10-26 DIAGNOSIS — F988 Other specified behavioral and emotional disorders with onset usually occurring in childhood and adolescence: Secondary | ICD-10-CM

## 2018-10-26 DIAGNOSIS — I351 Nonrheumatic aortic (valve) insufficiency: Secondary | ICD-10-CM

## 2018-10-26 DIAGNOSIS — Z13220 Encounter for screening for lipoid disorders: Secondary | ICD-10-CM | POA: Diagnosis not present

## 2018-10-26 DIAGNOSIS — L659 Nonscarring hair loss, unspecified: Secondary | ICD-10-CM

## 2018-10-26 DIAGNOSIS — F411 Generalized anxiety disorder: Secondary | ICD-10-CM

## 2018-10-26 DIAGNOSIS — Z131 Encounter for screening for diabetes mellitus: Secondary | ICD-10-CM | POA: Diagnosis not present

## 2018-10-26 DIAGNOSIS — Z0000 Encounter for general adult medical examination without abnormal findings: Secondary | ICD-10-CM

## 2018-10-26 DIAGNOSIS — E663 Overweight: Secondary | ICD-10-CM

## 2018-10-26 DIAGNOSIS — R200 Anesthesia of skin: Secondary | ICD-10-CM

## 2018-10-26 DIAGNOSIS — E781 Pure hyperglyceridemia: Secondary | ICD-10-CM

## 2018-10-26 MED ORDER — AMPHETAMINE-DEXTROAMPHETAMINE 30 MG PO TABS: ORAL_TABLET | ORAL | 0 refills | Status: DC

## 2018-10-29 LAB — CBC WITH DIFFERENTIAL/PLATELET
Absolute Monocytes: 722 cells/uL (ref 200–950)
Basophils Absolute: 61 cells/uL (ref 0–200)
Basophils Relative: 0.7 %
Eosinophils Absolute: 131 cells/uL (ref 15–500)
Eosinophils Relative: 1.5 %
HCT: 45.7 % (ref 38.5–50.0)
Hemoglobin: 15.9 g/dL (ref 13.2–17.1)
Lymphs Abs: 2741 {cells}/uL (ref 850–3900)
MCH: 29.6 pg (ref 27.0–33.0)
MCHC: 34.8 g/dL (ref 32.0–36.0)
MCV: 84.9 fL (ref 80.0–100.0)
MPV: 9.8 fL (ref 7.5–12.5)
Monocytes Relative: 8.3 %
Neutro Abs: 5046 cells/uL (ref 1500–7800)
Neutrophils Relative %: 58 %
Platelets: 239 Thousand/uL (ref 140–400)
RBC: 5.38 Million/uL (ref 4.20–5.80)
RDW: 12.2 % (ref 11.0–15.0)
Total Lymphocyte: 31.5 %
WBC: 8.7 Thousand/uL (ref 3.8–10.8)

## 2018-10-29 LAB — MAGNESIUM: Magnesium: 2.1 mg/dL (ref 1.5–2.5)

## 2018-10-29 LAB — LIPID PANEL
Cholesterol: 201 mg/dL — ABNORMAL HIGH (ref <200)
HDL: 42 mg/dL (ref >=40)
LDL Cholesterol (Calc): 133 mg/dL — ABNORMAL HIGH
Non-HDL Cholesterol (Calc): 159 mg/dL — ABNORMAL HIGH (ref <130)
Total CHOL/HDL Ratio: 4.8 (calc) (ref <5.0)
Triglycerides: 151 mg/dL — ABNORMAL HIGH (ref <150)

## 2018-10-29 LAB — HEMOGLOBIN A1C
Hgb A1c MFr Bld: 5.1 %{Hb} (ref <5.7)
Mean Plasma Glucose: 100 (calc)
eAG (mmol/L): 5.5 (calc)

## 2018-10-29 LAB — VITAMIN D 25 HYDROXY (VIT D DEFICIENCY, FRACTURES): Vit D, 25-Hydroxy: 29 ng/mL — ABNORMAL LOW (ref 30–100)

## 2018-10-29 LAB — TSH: TSH: 0.54 m[IU]/L (ref 0.40–4.50)

## 2018-10-29 LAB — INSULIN, RANDOM: Insulin: 9.9 u[IU]/mL

## 2018-10-29 LAB — MICROALBUMIN / CREATININE URINE RATIO
Creatinine, Urine: 158 mg/dL (ref 20–320)
Microalb Creat Ratio: 3 ug/mg{creat} (ref <30)
Microalb, Ur: 0.5 mg/dL

## 2018-10-30 ENCOUNTER — Other Ambulatory Visit: Payer: Self-pay | Admitting: Adult Health Nurse Practitioner

## 2018-10-30 DIAGNOSIS — E559 Vitamin D deficiency, unspecified: Secondary | ICD-10-CM

## 2018-10-30 MED ORDER — VITAMIN D (ERGOCALCIFEROL) 1.25 MG (50000 UNIT) PO CAPS
ORAL_CAPSULE | ORAL | 1 refills | Status: DC
Start: 2018-10-30 — End: 2019-05-20

## 2018-11-09 ENCOUNTER — Ambulatory Visit (HOSPITAL_COMMUNITY): Payer: BLUE CROSS/BLUE SHIELD

## 2018-12-06 ENCOUNTER — Other Ambulatory Visit: Payer: Self-pay

## 2018-12-06 DIAGNOSIS — F988 Other specified behavioral and emotional disorders with onset usually occurring in childhood and adolescence: Secondary | ICD-10-CM

## 2018-12-06 NOTE — Telephone Encounter (Signed)
Adderall refill request. Last rx written on 10/26/18, supposed to last until 12/10/18. In que for review with a DNF until date on it.

## 2018-12-07 MED ORDER — AMPHETAMINE-DEXTROAMPHETAMINE 30 MG PO TABS: ORAL_TABLET | ORAL | 0 refills | Status: DC

## 2018-12-13 ENCOUNTER — Other Ambulatory Visit: Payer: Self-pay

## 2018-12-13 ENCOUNTER — Ambulatory Visit (INDEPENDENT_AMBULATORY_CARE_PROVIDER_SITE_OTHER): Payer: BLUE CROSS/BLUE SHIELD | Admitting: Neurology

## 2018-12-13 ENCOUNTER — Encounter: Payer: Self-pay | Admitting: Neurology

## 2018-12-13 DIAGNOSIS — R202 Paresthesia of skin: Secondary | ICD-10-CM | POA: Diagnosis not present

## 2018-12-13 DIAGNOSIS — G40909 Epilepsy, unspecified, not intractable, without status epilepticus: Secondary | ICD-10-CM | POA: Diagnosis not present

## 2018-12-13 MED ORDER — CARBAMAZEPINE ER 400 MG PO TB12: 400.0000 mg | ORAL_TABLET | Freq: Two times a day (BID) | ORAL | 11 refills | Status: DC

## 2018-12-13 NOTE — Progress Notes (Signed)
PATIENT: William Willis DOB: Apr 12, 1981  Virtual Visit via video  I connected with William Willis on 12/13/18 at  by video and verified that I am speaking with the correct person using two identifiers.   I discussed the limitations, risks, security and privacy concerns of performing an evaluation and management service by video and the availability of in person appointments. I also discussed with the patient that there may be a patient responsible charge related to this service. The patient expressed understanding and agreed to proceed.  HISTORICAL  William Willis is a 38 year old male, seen in request by his primary care nurse practitioner William Willis, for evaluation of passing out spells.  His wife is present during interview   I have reviewed and summarized the referring note from the referring physician. He has PMHx of ADHD, anxiety, also reported history of seizure,  On August 07 2018, while driving to his work in the morning time he was noted by passenger that he was confused, his coworkers switched to drive seat with him, then he was noted not to responsive to conversation, his body slumped over, had transient loss of consciousness, at work, was noted to be confused, not responsive to questions, was taken to the urgent care, he only has partial memory of the process, remain confused for 2 hours, came back to baseline  He was taken to the emergency room at [facility], MRI of the brain with without contrast was reported normal, MRI of brain and neck showed no large vessel disease.  Laboratory evaluation showed normal CBC, CMP, UDS was positive for amphetamines and marijuana.  Reported a history of seizure since childhood, continued until age14, when he had his last generalized seizure, he also reported frequent staring spells.  He was treated with antiepileptic medications but could not recall the name of the medications  He is currently taking carbamazepine  200 mg twice a day, BuSpar 30 mg twice a day for his mood disorder  He work on Engineer, water, require repetitive hand and wrist movement, he complains of few months history of bilateral hands paresthesia, right worse than left, mainly involving the first four fingers     Observations/Objective: I have reviewed problem lists, medications, allergies.  Awake alert oriented to history taking and care of conversation, 4 extremities without difficulties  Assessment and Plan: History of complex partial seizure  Recurrent spells of confusion, loss of consciousness on August 07, 2018 most suggestive of complex partial seizure  Increase tegretol xr to  2 tabs bid  EEG  No driving until seizure free for 6 months  Bilateral hands Paresthesia  Most consistent with bilateral carpal tunnel syndrome  EMG/NCS  Follow Up Instructions:   3 months   I discussed the assessment and treatment plan with the patient. The patient was provided an opportunity to ask questions and all were answered. The patient agreed with the plan and demonstrated an understanding of the instructions.   The patient was advised to call back or seek an in-person evaluation if the symptoms worsen or if the condition fails to improve as anticipated.  I provided 30 minutes of non-face-to-face time during this encounter.  REVIEW OF SYSTEMS: Full 14 system review of systems performed and notable only for as above. All other review of systems were negative.  ALLERGIES: No Known Allergies  HOME MEDICATIONS: Current Outpatient Medications  Medication Sig Dispense Refill  . amphetamine-dextroamphetamine (ADDERALL) 30 MG tablet Take 1/2 to 1or 2 tablets /day & please  try to limit to 5 days /week (10 tab/week) to avoid addiction. 60 tablet 0  . busPIRone (BUSPAR) 30 MG tablet Take 1/2 to 1 tablet 2 x /day as needed for Anxiety 180 tablet 0  . carbamazepine (TEGRETOL) 200 MG tablet Take 1 tablet 3 x / day for Mood  270 tablet 0  . Cholecalciferol (VITAMIN D PO) Take 1,200 Units by mouth daily.    . metoprolol tartrate (LOPRESSOR) 25 MG tablet Take one tablet by mouth 2 hours prior to CT scan (Patient not taking: Reported on 10/26/2018) 1 tablet 0  . Vitamin D, Ergocalciferol, (DRISDOL) 1.25 MG (50000 UT) CAPS capsule 1 pill 3 days a week for vitamin d deficiency 36 capsule 1   No current facility-administered medications for this visit.     PAST MEDICAL HISTORY: Past Medical History:  Diagnosis Date  . ADD (attention deficit disorder)   . Anxiety   . Aortic insufficiency   . Pneumonia   . Tobacco abuse     PAST SURGICAL HISTORY: Past Surgical History:  Procedure Laterality Date  . ADENOIDECTOMY    . SHOULDER SURGERY Right 12-08-11  . TONSILLECTOMY    . WISDOM TOOTH EXTRACTION  10/19/2017    FAMILY HISTORY: Family History  Problem Relation Age of Onset  . Heart attack Maternal Grandfather        x11  . Stroke Maternal Grandfather        x2    SOCIAL HISTORY:   Social History   Socioeconomic History  . Marital status: Married    Spouse name: Not on file  . Number of children: 2  . Years of education: 42  . Highest education level: High school graduate  Occupational History  . Occupation: Works in Psychologist, forensic  Social Needs  . Financial resource strain: Not on file  . Food insecurity:    Worry: Not on file    Inability: Not on file  . Transportation needs:    Medical: Not on file    Non-medical: Not on file  Tobacco Use  . Smoking status: Current Every Day Smoker    Packs/day: 2.00    Years: 15.00    Pack years: 30.00    Types: Cigarettes  . Smokeless tobacco: Never Used  Substance and Sexual Activity  . Alcohol use: Yes    Comment: rare  . Drug use: Yes    Types: Marijuana  . Sexual activity: Yes    Partners: Female  Lifestyle  . Physical activity:    Days per week: Not on file    Minutes per session: Not on file  . Stress: Not on file  Relationships  .  Social connections:    Talks on phone: Not on file    Gets together: Not on file    Attends religious service: Not on file    Active member of club or organization: Not on file    Attends meetings of clubs or organizations: Not on file    Relationship status: Not on file  . Intimate partner violence:    Fear of current or ex partner: Not on file    Emotionally abused: Not on file    Physically abused: Not on file    Forced sexual activity: Not on file  Other Topics Concern  . Not on file  Social History Narrative  . Not on file    Levert Feinstein, M.D. Ph.D.  [facility] [address] [place], [place] [address] Ph: [phone]  Fax: [phone]  CC: Referring Provider

## 2018-12-14 ENCOUNTER — Ambulatory Visit (HOSPITAL_COMMUNITY): Payer: BLUE CROSS/BLUE SHIELD

## 2018-12-14 ENCOUNTER — Other Ambulatory Visit (HOSPITAL_COMMUNITY): Payer: BLUE CROSS/BLUE SHIELD

## 2019-01-01 ENCOUNTER — Ambulatory Visit: Payer: BLUE CROSS/BLUE SHIELD | Admitting: Neurology

## 2019-01-02 ENCOUNTER — Telehealth: Payer: Self-pay | Admitting: Cardiovascular Disease

## 2019-01-02 NOTE — Telephone Encounter (Signed)
Left message to call and schedule cardiac ct  °

## 2019-01-21 ENCOUNTER — Other Ambulatory Visit: Payer: Self-pay

## 2019-01-21 DIAGNOSIS — F988 Other specified behavioral and emotional disorders with onset usually occurring in childhood and adolescence: Secondary | ICD-10-CM

## 2019-01-21 MED ORDER — AMPHETAMINE-DEXTROAMPHETAMINE 30 MG PO TABS: ORAL_TABLET | ORAL | 0 refills | Status: DC

## 2019-01-21 NOTE — Telephone Encounter (Signed)
Adderall refill request. In que for your review.  

## 2019-01-30 ENCOUNTER — Ambulatory Visit (HOSPITAL_COMMUNITY): Payer: BLUE CROSS/BLUE SHIELD

## 2019-01-30 ENCOUNTER — Other Ambulatory Visit (HOSPITAL_COMMUNITY): Payer: BLUE CROSS/BLUE SHIELD

## 2019-02-07 NOTE — Telephone Encounter (Signed)
Cardiac CTA is July 10

## 2019-02-28 ENCOUNTER — Other Ambulatory Visit: Payer: Self-pay | Admitting: Adult Health

## 2019-02-28 DIAGNOSIS — F988 Other specified behavioral and emotional disorders with onset usually occurring in childhood and adolescence: Secondary | ICD-10-CM

## 2019-02-28 DIAGNOSIS — F411 Generalized anxiety disorder: Secondary | ICD-10-CM

## 2019-02-28 MED ORDER — AMPHETAMINE-DEXTROAMPHETAMINE 30 MG PO TABS: ORAL_TABLET | ORAL | 0 refills | Status: DC

## 2019-02-28 MED ORDER — BUSPIRONE HCL 30 MG PO TABS: ORAL_TABLET | ORAL | 0 refills | Status: DC

## 2019-03-01 ENCOUNTER — Ambulatory Visit (HOSPITAL_COMMUNITY): Payer: BC Managed Care – PPO | Attending: Cardiovascular Disease

## 2019-03-01 ENCOUNTER — Ambulatory Visit (HOSPITAL_COMMUNITY): Admission: RE | Admit: 2019-03-01 | Payer: BC Managed Care – PPO | Source: Ambulatory Visit

## 2019-03-08 ENCOUNTER — Encounter: Payer: Self-pay | Admitting: Cardiovascular Disease

## 2019-03-11 DIAGNOSIS — Z20828 Contact with and (suspected) exposure to other viral communicable diseases: Secondary | ICD-10-CM | POA: Diagnosis not present

## 2019-03-11 DIAGNOSIS — Z711 Person with feared health complaint in whom no diagnosis is made: Secondary | ICD-10-CM | POA: Diagnosis not present

## 2019-04-01 ENCOUNTER — Other Ambulatory Visit (HOSPITAL_COMMUNITY): Payer: BC Managed Care – PPO

## 2019-04-08 ENCOUNTER — Other Ambulatory Visit: Payer: Self-pay

## 2019-04-08 ENCOUNTER — Encounter: Payer: Self-pay | Admitting: Physician Assistant

## 2019-04-08 ENCOUNTER — Ambulatory Visit: Payer: BLUE CROSS/BLUE SHIELD | Admitting: Physician Assistant

## 2019-04-08 ENCOUNTER — Telehealth: Payer: Self-pay | Admitting: *Deleted

## 2019-04-08 VITALS — BP 136/74 | HR 86 | Temp 97.8°F | Wt 163.6 lb

## 2019-04-08 DIAGNOSIS — M545 Low back pain, unspecified: Secondary | ICD-10-CM

## 2019-04-08 DIAGNOSIS — R8290 Unspecified abnormal findings in urine: Secondary | ICD-10-CM | POA: Diagnosis not present

## 2019-04-08 MED ORDER — GABAPENTIN 300 MG PO CAPS: ORAL_CAPSULE | ORAL | 2 refills | Status: DC

## 2019-04-08 MED ORDER — DEXAMETHASONE SODIUM PHOSPHATE 100 MG/10ML IJ SOLN
10.0000 mg | Freq: Once | INTRAMUSCULAR | Status: AC
  Administered 2019-04-08: 10 mg via INTRAMUSCULAR

## 2019-04-08 MED ORDER — PREDNISONE 20 MG PO TABS: ORAL_TABLET | ORAL | 0 refills | Status: AC

## 2019-04-08 MED ORDER — CYCLOBENZAPRINE HCL 10 MG PO TABS: 10.0000 mg | ORAL_TABLET | Freq: Every evening | ORAL | 1 refills | Status: DC | PRN

## 2019-04-08 NOTE — Patient Instructions (Signed)
BACK PAIN  Try the exercises and other information in the back care manual.   You can take prednisone as prescribed  (avoid taking other NSAIDS like Alleve or Ibuprofen while taking this) Can take tylenol  You can take flexeril if needed at bedtime for muscle spasm. This can be taken up to every 8 hours, but causes sedation, so should not drive or operate heavy machinery while taking this medicine.   MAXIMUM AMOUNT OF TYLENOL IN A DAY  You can take tylenol (500mg ) or tylenol arthritis (650mg ) with the meloxicam/antiinflammatories. The max you can take of tylenol a day is 3000mg  daily, this is a max of 6 pills a day of the regular tyelnol (500mg ) or a max of 4 a day of the tylenol arthritis (650mg ) as long as no other medications you are taking contain tylenol.    Go to the ER if you have any new weakness in your legs, have trouble controlling your urine or bowels, or have worsening pain.   If you are not better in 1-3 month we will refer you to ortho Or PT  Can take the gabapentin 300mg  at night.   It can make you sleepy so we suggest trying it at night first and please plan to not drive or do anything strenuous.  Also please do not take this medication with alcohol.   Start out 1 pill at night before bed, can increase to 2 pills at night before bed. Please call the office if you have any side effects.   Can take 3 pills a day total however you would like  Some examples: - 1 breakfast, lunch, bedtime. - 1 at breakfast, 2 at bed time  Common side effects are sleepiness, concentration problems, dizziness, swelling.  Do not stop abruptly unless you have a reaction to it.     Back pain Rehab Ask your health care provider which exercises are safe for you. Do exercises exactly as told by your health care provider and adjust them as directed. It is normal to feel mild stretching, pulling, tightness, or discomfort as you do these exercises, but you should stop right away if you feel  sudden pain or your pain gets worse. Do not begin these exercises until told by your health care provider. Stretching and range of motion exercises These exercises warm up your muscles and joints and improve the movement and flexibility of your hips and your back. These exercises also help to relieve pain, numbness, and tingling. Exercise A: Sciatic nerve glide 1. Sit in a chair with your head facing down toward your chest. Place your hands behind your back. Let your shoulders slump forward. 2. Slowly straighten one of your knees while you tilt your head back as if you are looking toward the ceiling. Only straighten your leg as far as you can without making your symptoms worse. 3. Hold for __________ seconds. 4. Slowly return to the starting position. 5. Repeat with your other leg. Repeat __________ times. Complete this exercise __________ times a day. Exercise B: Knee to chest with hip adduction and internal rotation  1. Lie on your back on a firm surface with both legs straight. 2. Bend one of your knees and move it up toward your chest until you feel a gentle stretch in your lower back and buttock. Then, move your knee toward the shoulder that is on the opposite side from your leg. ? Hold your leg in this position by holding onto the front of your knee.  3. Hold for __________ seconds. 4. Slowly return to the starting position. 5. Repeat with your other leg. Repeat __________ times. Complete this exercise __________ times a day. Exercise C: Prone extension on elbows  1. Lie on your abdomen on a firm surface. A bed may be too soft for this exercise. 2. Prop yourself up on your elbows. 3. Use your arms to help lift your chest up until you feel a gentle stretch in your abdomen and your lower back. ? This will place some of your body weight on your elbows. If this is uncomfortable, try stacking pillows under your chest. ? Your hips should stay down, against the surface that you are lying on.  Keep your hip and back muscles relaxed. 4. Hold for __________ seconds. 5. Slowly relax your upper body and return to the starting position. Repeat __________ times. Complete this exercise __________ times a day. Strengthening exercises These exercises build strength and endurance in your back. Endurance is the ability to use your muscles for a long time, even after they get tired. Exercise D: Pelvic tilt 1. Lie on your back on a firm surface. Bend your knees and keep your feet flat. 2. Tense your abdominal muscles. Tip your pelvis up toward the ceiling and flatten your lower back into the floor. ? To help with this exercise, you may place a small towel under your lower back and try to push your back into the towel. 3. Hold for __________ seconds. 4. Let your muscles relax completely before you repeat this exercise. Repeat __________ times. Complete this exercise __________ times a day. Exercise E: Alternating arm and leg raises  1. Get on your hands and knees on a firm surface. If you are on a hard floor, you may want to use padding to cushion your knees, such as an exercise mat. 2. Line up your arms and legs. Your hands should be below your shoulders, and your knees should be below your hips. 3. Lift your left leg behind you. At the same time, raise your right arm and straighten it in front of you. ? Do not lift your leg higher than your hip. ? Do not lift your arm higher than your shoulder. ? Keep your abdominal and back muscles tight. ? Keep your hips facing the ground. ? Do not arch your back. ? Keep your balance carefully, and do not hold your breath. 4. Hold for __________ seconds. 5. Slowly return to the starting position and repeat with your right leg and your left arm. Repeat __________ times. Complete this exercise __________ times a day. Posture and body mechanics  Body mechanics refers to the movements and positions of your body while you do your daily activities. Posture is  part of body mechanics. Good posture and healthy body mechanics can help to relieve stress in your body's tissues and joints. Good posture means that your spine is in its natural S-curve position (your spine is neutral), your shoulders are pulled back slightly, and your head is not tipped forward. The following are general guidelines for applying improved posture and body mechanics to your everyday activities. Standing   When standing, keep your spine neutral and your feet about hip-width apart. Keep a slight bend in your knees. Your ears, shoulders, and hips should line up.  When you do a task in which you stand in one place for a long time, place one foot up on a stable object that is 2-4 inches (5-10 cm) high, such as a footstool.  This helps keep your spine neutral. Sitting   When sitting, keep your spine neutral and keep your feet flat on the floor. Use a footrest, if necessary, and keep your thighs parallel to the floor. Avoid rounding your shoulders, and avoid tilting your head forward.  When working at a desk or a computer, keep your desk at a height where your hands are slightly lower than your elbows. Slide your chair under your desk so you are close enough to maintain good posture.  When working at a computer, place your monitor at a height where you are looking straight ahead and you do not have to tilt your head forward or downward to look at the screen. Resting   When lying down and resting, avoid positions that are most painful for you.  If you have pain with activities such as sitting, bending, stooping, or squatting (flexion-based activities), lie in a position in which your body does not bend very much. For example, avoid curling up on your side with your arms and knees near your chest (fetal position).  If you have pain with activities such as standing for a long time or reaching with your arms (extension-based activities), lie with your spine in a neutral position and bend  your knees slightly. Try the following positions: ? Lying on your side with a pillow between your knees. ? Lying on your back with a pillow under your knees. Lifting   When lifting objects, keep your feet at least shoulder-width apart and tighten your abdominal muscles.  Bend your knees and hips and keep your spine neutral. It is important to lift using the strength of your legs, not your back. Do not lock your knees straight out.  Always ask for help to lift heavy or awkward objects. This information is not intended to replace advice given to you by your health care provider. Make sure you discuss any questions you have with your health care provider. Document Released: 08/08/2005 Document Revised: 04/14/2016 Document Reviewed: 04/24/2015 Elsevier Interactive Patient Education  [employer]2018 [employer]

## 2019-04-08 NOTE — Telephone Encounter (Signed)
Pt missed appointments in June and July for cardiac CT.  Our scheduling department reached out to him by mail but pt has not responded.  Order for CT cancelled.

## 2019-04-08 NOTE — Progress Notes (Signed)
Subjective:    Patient ID: William Willis, male    DOB: 1980/10/11, 38 y.o.   MRN: [id number]  HPI 38 y.o. WM with history of aotic valve insuff, back pain has seen Dr. Erlinda Hong, has history of kidney stones in the past in the past presents with back pain x 5 weeks.   States he picked up something heavy at work 5 weeks ago, an Librarian, academic but he also helped his sister in law move that weekend. Had pain but states he continued to work because this is not uncommon and it will go away usually. Constant right lower back pain, that shoots down his back leg all the way down to his toes and has shot up to his right arm.  This continued to hurt him, having trouble sleeping, worse with walking and standing for long periods. He is waking up in the middle of the night. He has had kidney stones in the past, some pain with this and nausea like previously.   He is taking ibuprofen 200mg  4-5 3 x a day with 3 Goody's, stretches, heat or ice but it is not helping. Denies fever, hematuria, incontinence, numbness, tingling, weakness and saddle anesthesia   Blood pressure 136/74, pulse 86, temperature 97.8 F (36.6 C), weight 163 lb 9.6 oz (74.2 kg), SpO2 98 %.  Medications Current Outpatient Medications on File Prior to Visit  Medication Sig  . amphetamine-dextroamphetamine (ADDERALL) 30 MG tablet Take 1/2 to 1or 2 tablets /day & please try to limit to 5 days /week (10 tab/week) to avoid addiction.  . busPIRone (BUSPAR) 30 MG tablet Take 1/2 to 1 tablet 2 x /day as needed for Anxiety  . carbamazepine (TEGRETOL-XR) 400 MG 12 hr tablet Take 1 tablet (400 mg total) by mouth 2 (two) times daily.  . Vitamin D, Ergocalciferol, (DRISDOL) 1.25 MG (50000 UT) CAPS capsule 1 pill 3 days a week for vitamin d deficiency   No current facility-administered medications on file prior to visit.     Problem list He has ADD (attention deficit disorder); Vitamin D deficiency; Anxiety state; Dysthymia; Acute right-sided low back pain  without sciatica; Medication management; History of smoking 30 or more pack years; Hyperlipidemia; Overweight (BMI 25.0-29.9); Aortic insufficiency; Seizure disorder ([place]); and Paresthesia on their problem list.   Review of Systems  Constitutional: Negative.  Negative for fever.  HENT: Negative.   Respiratory: Negative.   Cardiovascular: Negative.  Negative for chest pain.  Gastrointestinal: Negative.  Negative for abdominal pain.  Genitourinary: Negative.  Negative for dysuria.  Musculoskeletal: Positive for back pain and gait problem. Negative for arthralgias, joint swelling, myalgias, neck pain and neck stiffness.  Skin: Negative.   Neurological: Negative for weakness, numbness and headaches.       Objective:   Physical Exam Constitutional:      Appearance: He is well-developed.  HENT:     Head: Normocephalic and atraumatic.  Cardiovascular:     Rate and Rhythm: Normal rate and regular rhythm.  Pulmonary:     Effort: Pulmonary effort is normal.     Breath sounds: Normal breath sounds.  Abdominal:     General: Bowel sounds are normal.     Palpations: Abdomen is soft.     Tenderness: There is no abdominal tenderness.  Musculoskeletal:     Comments: Patient is able to ambulate well. Gait is  Antalgic. Straight leg raising with dorsiflexion positive bilaterally for radicular symptoms. Sensory exam in the legs are normal. Knee reflexes are normal Ankle  reflexes are normal Strength is normal and symmetric in arms and legs. There is not SI tenderness to palpation.  There is right sided paraspinal muscle spasm.  There is not midline tenderness.  ROM of spine with  limited in all spheres due to pain.   Skin:    Findings: No rash.  Neurological:     Mental Status: He is alert and oriented to person, place, and time.     Deep Tendon Reflexes: Reflexes are normal and symmetric.       Assessment & Plan:   Acute right-sided low back pain without sciatica Likely muscular possible disc  herniation Try oral prednisone, no red flags, good strength/sensation -     Urinalysis, Routine w reflex microscopic -     Urine Culture -     predniSONE (DELTASONE) 20 MG tablet; 1 pill 3 x a day for 3 days, 1 pill 2 x a day x 3 days, 1 pill a day x 5 days with food -     gabapentin (NEURONTIN) 300 MG capsule; 1 pill up to 3 x a day for nerve pain -     cyclobenzaprine (FLEXERIL) 10 MG tablet; Take 1 tablet (10 mg total) by mouth at bedtime as needed for muscle spasms. -     dexamethasone (DECADRON) injection 10 mg Verbal consent obtained, risk and benefits were addressed with patient. A trigger point injection was performed at the site of maximal tenderness using 1% plain Lidocaine and Dexamethasone. This was well tolerated, and followed by immediate relief of pain. Return precautions discussed with the patient.   Abnormal urine -     Urinalysis, Routine w reflex microscopic -     Urine Culture Rule out kidney stone but very mechanical and likely musckloskeletal

## 2019-04-10 LAB — URINALYSIS, ROUTINE W REFLEX MICROSCOPIC
Bilirubin Urine: NEGATIVE
Glucose, UA: NEGATIVE
Hgb urine dipstick: NEGATIVE
Ketones, ur: NEGATIVE
Leukocytes,Ua: NEGATIVE
Nitrite: NEGATIVE
Protein, ur: NEGATIVE
Specific Gravity, Urine: 1.034 (ref 1.001–1.03)
pH: <=5 (ref 5.0–8.0)

## 2019-04-10 LAB — URINE CULTURE
MICRO NUMBER:: 779411
SPECIMEN QUALITY:: ADEQUATE

## 2019-04-15 ENCOUNTER — Telehealth: Payer: Self-pay | Admitting: Physician Assistant

## 2019-04-15 ENCOUNTER — Ambulatory Visit: Payer: BLUE CROSS/BLUE SHIELD | Admitting: Cardiovascular Disease

## 2019-04-15 DIAGNOSIS — F988 Other specified behavioral and emotional disorders with onset usually occurring in childhood and adolescence: Secondary | ICD-10-CM

## 2019-04-15 MED ORDER — AMPHETAMINE-DEXTROAMPHETAMINE 30 MG PO TABS: ORAL_TABLET | ORAL | 0 refills | Status: DC

## 2019-04-15 NOTE — Telephone Encounter (Signed)
-----   Message from Elenor Quinones, [place] sent at 04/15/2019  9:39 AM EDT ----- Regarding: med refill Contact: [phone] PER PT/YELLOW NOTE:  Refill on ADDERALL Please & thank you!  Pharmacy:  [facility] dr Linna Hoff

## 2019-04-24 NOTE — Progress Notes (Deleted)
FOLLOW UP  Assessment and Plan:   Diagnoses and all orders for this visit:  Aortic valve insufficiency  ***  Dysthymia/Anxiety state Continue medications, reports currently well controlled Lifestyle discussed: diet/exerise, sleep hygiene, stress management, hydration  Attention deficit disorder (ADD) without hyperactivity Continue medications Helps with focus, no AE's. The patient was counseled on the addictive nature of the medication and was encouraged to take drug holidays when not needed.   Vitamin D deficiency Patient has initiated supplement Discussed goal 70-100 Defer vitamin D level  History of smoking 30 or more pack years *** Discussed risks associated with tobacco use and advised to reduce or quit Patient is not ready to do so, but advised to consider strongly Will follow up at the next visit  Pure hyperglyceridemia Recommended low cholesterol diet and exercise.  Check lipid panel   Overweight (BMI 25.0-29.9) Continue to recommend diet heavy in fruits and veggies and low in animal meats, cheeses, and dairy products, appropriate calorie intake Discuss exercise recommendations routinely Continue to monitor weight at each visit  Seizure disorder (HCC) Followed by Dr. Terrace ArabiaYan On tegretol XR 400 mg BID *** Reminded needs annual eye exam with this medication ***     Continue diet and meds as discussed. Further disposition pending results of labs. Discussed med's effects and SE's.   Over 30 minutes of exam, counseling, chart review, and critical decision making was performed.   Future Appointments  Date Time Provider Department Center  04/30/2019  8:45 AM Judd Gaudierorbett, Jyaire Koudelka, NP [facility] None  10/28/2019  3:00 PM Judd Gaudierorbett, Bronsyn Shappell, NP [facility] None    ----------------------------------------------------------------------------------------------------------------------  HPI 38 y.o. male  presents for 6 month follow up on mood/anxiety, ADD, smoking, hyperlipidemia,  weight, vitamin D deficiency.   He recently had a cardiac evaluation by Dr Clifton JamesMcAlhany related to episode of chest pain and left side numbess in December of 2019.  [facility] 55-60% with mod aortic valve regurgitation, cardiac enzymes and Neurology workup negative.  *** He has a CTA scheduled related to heavy tobacco use and FH of CAD.  In six months a repeat ECHO.  No medications changes.   He had episode of confusion/syncope on Aug 07, 2018 and was referred to Dr. Terrace ArabiaYan; she feels most suggestive of complex partial seizure, tegretol XR was increased to 200 mg 2 tabd BID and advised no driving until seizure free for 6 months ***  *** ? Check levels, remind needs to be getting eye exams including IOP on this medication   Dr. Terrace ArabiaYan also evaluated for reports of bilateral upper extremity parasthesias;  Reports that he has numbness and tingling in bilateral upper extremities.  He reports it varies in occurrence and unable to link to specific activity.  The duration is 10-6715min.  Reports he tried shaking or using his arm and he does put an arm above his head resting on it and it helps some.  He also reports a burning napping in his right elbow.  He has history of  Tendonitis and has worn an OTC brace that provies minimal relief.   History of complex partial seizure             Recurrent spells of confusion, loss of consciousness on August 07, 2018 most suggestive of complex partial seizure             Increase tegretol xr to 200mg  2 tabs bid             EEG  No driving until seizure free for 6 months  Bilateral hands Paresthesia             Most consistent with bilateral carpal tunnel syndrome             EMG/NCS   he currently continues to smoke 2 pack a day; discussed risks associated with smoking, patient is not ready to quit.   he has a diagnosis of dysthymia/anxiety, questionable bipolar with hx of mood disorder since childhood; has failed multiple mood agents,  most recently wellbutrin; currently on carbamazepine 200 mg TID, buspar 15-30 mg BID (currently taking 15 mg BID), reports symptoms are well controlled on current regimen. he reports current medication regimen has given best control over the years of trial/error.   Patient is on an ADD medication, takes 1.5 tabs on days that he works, 1/2 tab on days he's off. he states that the medication is helping and he denies any adverse reactions.   BMI is There is no height or weight on file to calculate BMI., he has not been working on diet and exercise, physically intense job replacing HVAC. He admits to eating cheeseburgers and fries most days.  Wt Readings from Last 3 Encounters:  04/08/19 163 lb 9.6 oz (74.2 kg)  10/26/18 163 lb 6.4 oz (74.1 kg)  10/15/18 164 lb (74.4 kg)   Today their BP is    He does not workout. He denies chest pain, shortness of breath, dizziness.   He is not on cholesterol medication. His cholesterol is not at goal. The cholesterol last visit was:  Lab Results  Component Value Date   CHOL 201 (H) 10/26/2018   HDL 42 10/26/2018   LDLCALC 133 (H) 10/26/2018   TRIG 151 (H) 10/26/2018   CHOLHDL 4.8 10/26/2018    Patient is *** on Vitamin D supplement.   Lab Results  Component Value Date   VD25OH 29 (L) 10/26/2018        Current Medications:  Current Outpatient Medications on File Prior to Visit  Medication Sig  . amphetamine-dextroamphetamine (ADDERALL) 30 MG tablet Take 1/2 to 1or 2 tablets /day & please try to limit to 5 days /week (10 tab/week) to avoid addiction.  . busPIRone (BUSPAR) 30 MG tablet Take 1/2 to 1 tablet 2 x /day as needed for Anxiety  . carbamazepine (TEGRETOL-XR) 400 MG 12 hr tablet Take 1 tablet (400 mg total) by mouth 2 (two) times daily.  . cyclobenzaprine (FLEXERIL) 10 MG tablet Take 1 tablet (10 mg total) by mouth at bedtime as needed for muscle spasms.  Marland Kitchen gabapentin (NEURONTIN) 300 MG capsule 1 pill up to 3 x a day for nerve pain  . Vitamin  D, Ergocalciferol, (DRISDOL) 1.25 MG (50000 UT) CAPS capsule 1 pill 3 days a week for vitamin d deficiency   No current facility-administered medications on file prior to visit.      Allergies: No Known Allergies   Medical History:  Past Medical History:  Diagnosis Date  . ADD (attention deficit disorder)   . Anxiety   . Aortic insufficiency   . Pneumonia   . Tobacco abuse    Family history- Reviewed and unchanged Social history- Reviewed and unchanged   Review of Systems:  Review of Systems  Constitutional: Negative for malaise/fatigue and weight loss.  HENT: Negative for hearing loss and tinnitus.   Eyes: Negative for blurred vision and double vision.  Respiratory: Negative for cough, shortness of breath and wheezing.   Cardiovascular: Negative for  chest pain, palpitations, orthopnea, claudication and leg swelling.  Gastrointestinal: Negative for abdominal pain, blood in stool, constipation, diarrhea, heartburn, melena, nausea and vomiting.  Genitourinary: Negative.   Musculoskeletal: Negative for joint pain and myalgias.  Skin: Negative for rash.  Neurological: Negative for dizziness, tingling, sensory change, weakness and headaches.  Endo/Heme/Allergies: Negative for polydipsia.  Psychiatric/Behavioral: Negative.   All other systems reviewed and are negative.    Physical Exam: There were no vitals taken for this visit. Wt Readings from Last 3 Encounters:  04/08/19 163 lb 9.6 oz (74.2 kg)  10/26/18 163 lb 6.4 oz (74.1 kg)  10/15/18 164 lb (74.4 kg)   General Appearance: Well nourished, in no apparent distress. Eyes: PERRLA, EOMs, conjunctiva no swelling or erythema Sinuses: No Frontal/maxillary tenderness ENT/Mouth: Ext aud canals clear, TMs without erythema, bulging. No erythema, swelling, or exudate on post pharynx.  Tonsils not swollen or erythematous. Hearing normal.  Neck: Supple, thyroid normal.  Respiratory: Respiratory effort normal, BS equal bilaterally  without rales, rhonchi, wheezing or stridor.  Cardio: RRR with coarse 2/6 systolic murmur. Brisk peripheral pulses without edema.  Abdomen: Soft, + BS.  Non tender, no guarding, rebound, hernias, masses. Lymphatics: Non tender without lymphadenopathy.  Musculoskeletal: Full ROM, 5/5 strength, Normal gait Skin: Warm, dry without rashes, lesions, ecchymosis.  Neuro: Cranial nerves intact. No cerebellar symptoms.  Psych: Awake and oriented X 3, normal affect, Insight and Judgment appropriate.    Izora Ribas, NP 9:04 AM [facility]

## 2019-04-30 ENCOUNTER — Other Ambulatory Visit: Payer: Self-pay | Admitting: Adult Health

## 2019-04-30 ENCOUNTER — Ambulatory Visit: Payer: Self-pay | Admitting: Adult Health

## 2019-04-30 DIAGNOSIS — M545 Low back pain, unspecified: Secondary | ICD-10-CM

## 2019-04-30 MED ORDER — GABAPENTIN 300 MG PO CAPS: ORAL_CAPSULE | ORAL | 2 refills | Status: DC

## 2019-05-01 NOTE — Addendum Note (Signed)
Addended by: Vicie Mutters R on: 05/01/2019 08:27 AM   Modules accepted: Orders

## 2019-05-07 ENCOUNTER — Ambulatory Visit: Payer: Self-pay

## 2019-05-07 ENCOUNTER — Ambulatory Visit (INDEPENDENT_AMBULATORY_CARE_PROVIDER_SITE_OTHER): Payer: BC Managed Care – PPO | Admitting: Orthopaedic Surgery

## 2019-05-07 ENCOUNTER — Encounter: Payer: Self-pay | Admitting: Orthopaedic Surgery

## 2019-05-07 VITALS — Ht 66.5 in | Wt 163.0 lb

## 2019-05-07 DIAGNOSIS — G8929 Other chronic pain: Secondary | ICD-10-CM

## 2019-05-07 DIAGNOSIS — M545 Low back pain, unspecified: Secondary | ICD-10-CM

## 2019-05-07 DIAGNOSIS — M25551 Pain in right hip: Secondary | ICD-10-CM

## 2019-05-07 MED ORDER — PREDNISONE 10 MG (21) PO TBPK: ORAL_TABLET | ORAL | 0 refills | Status: DC

## 2019-05-07 MED ORDER — METHOCARBAMOL 500 MG PO TABS: 500.0000 mg | ORAL_TABLET | Freq: Two times a day (BID) | ORAL | 0 refills | Status: DC | PRN

## 2019-05-07 NOTE — Progress Notes (Signed)
Subjective: Patient is here for ultrasound-guided intra-articular right hip injection.   Low back and right groin pain for 2 months.  Objective:  Pain with passive IR.  Procedure: Ultrasound-guided right hip injection: After sterile prep with Betadine, injected 8 cc 1% lidocaine without epinephrine and 40 mg methylprednisolone using a 22-gauge spinal needle, passing the needle through the iliofemoral ligament into the femoral head/neck junction.  Injectate seen filling capsule.  Minimal relief with anesthetic.
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
## 2019-05-08 ENCOUNTER — Encounter: Payer: Self-pay | Admitting: Orthopaedic Surgery

## 2019-05-08 ENCOUNTER — Telehealth: Payer: Self-pay | Admitting: Physician Assistant

## 2019-05-08 DIAGNOSIS — G8929 Other chronic pain: Secondary | ICD-10-CM

## 2019-05-08 DIAGNOSIS — M545 Low back pain, unspecified: Secondary | ICD-10-CM

## 2019-05-08 NOTE — Telephone Encounter (Signed)
Can you let them patient know that he needs to go ahead and start the steroid taper that I called in yesterday and that we will go ahead and order lumbar spine MRI.  Can you put in order for mri?

## 2019-05-08 NOTE — Telephone Encounter (Signed)
S/w patient and advised. He started steroid pack today.

## 2019-05-14 ENCOUNTER — Telehealth: Payer: Self-pay | Admitting: Orthopaedic Surgery

## 2019-05-14 NOTE — Telephone Encounter (Signed)
Received call from [facility] Mining engineer) asking for a call back concerning MRI being denied. The number to contact Jil is [phone] EBR: [id number]

## 2019-05-14 NOTE — Telephone Encounter (Signed)
IC Sharee Pimple and LM asking her to call me at my number.

## 2019-05-15 ENCOUNTER — Telehealth: Payer: Self-pay | Admitting: Orthopaedic Surgery

## 2019-05-15 ENCOUNTER — Other Ambulatory Visit: Payer: Self-pay | Admitting: Physician Assistant

## 2019-05-15 MED ORDER — DICLOFENAC SODIUM 75 MG PO TBEC: 75.0000 mg | DELAYED_RELEASE_TABLET | Freq: Two times a day (BID) | ORAL | 2 refills | Status: DC

## 2019-05-15 MED ORDER — HYDROCODONE-ACETAMINOPHEN 5-325 MG PO TABS: 1.0000 | ORAL_TABLET | Freq: Two times a day (BID) | ORAL | 0 refills | Status: DC | PRN

## 2019-05-15 NOTE — Telephone Encounter (Signed)
I called patient and advised of two meds called in. I also explained MRI was originally denied by insurance and this is being appealed to see if we can get approval. Patient expressed understanding.

## 2019-05-15 NOTE — Telephone Encounter (Signed)
Patient called. Says nothing is helping his pain. He also needs more pain meds. He states that he has not heard anything about the MRI. His call back number is [phone]

## 2019-05-15 NOTE — Telephone Encounter (Signed)
FYI.Marland KitchenMarland KitchenJust wanted you to see I have spoken with patient regarding status of MRI.

## 2019-05-15 NOTE — Telephone Encounter (Signed)
Thank you! I was not expecting it to take so long.  I will call them back again.

## 2019-05-15 NOTE — Telephone Encounter (Signed)
Please advise on pain medication.  William Willis has been working on MRI denial.

## 2019-05-15 NOTE — Telephone Encounter (Signed)
Sent in two meds

## 2019-05-15 NOTE — Telephone Encounter (Signed)
Can you dictate a note for me to send in to them?  They re opened the case and I have to send it to them by Monday 05/20/19.  As soon as I can fax it they will review and advise.  Fax [phone], attn to Appeals. Please let me know once note is done, and I will send it in .

## 2019-05-16 NOTE — Telephone Encounter (Signed)
I sent it in, thanks

## 2019-05-16 NOTE — Telephone Encounter (Signed)
I just finished a new note which I created from his visit from 9/15

## 2019-05-16 NOTE — Progress Notes (Addendum)
Patient: William Willis           Date of Birth: 10/18/1980           MRN: [id number] Visit Date: 05/07/2019 PCP: Unk Pinto, MD   Assessment & Plan:  Chief Complaint:  Chief Complaint  Patient presents with  . Lower Back - Pain   Visit Diagnoses:  1. Chronic right-sided low back pain, unspecified whether sciatica present   2. 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  This initially began back in February.  His pain somewhat improved at that time with a steroid Dosepak and several months of a home exercise therapy program.  When he returned to our office he was having pain not only to the right lower back but was also radiating down his buttocks, down the back of his leg and into the groin.  This started approximately 2 months ago when he picked up something heavy which reaggravated his symptoms.  The pain worsened over the past several weeks to the point where he is unable to sit or stand for long period of time.  He has recently noticed weakness to the right lower extremity.  He has been taking Tylenol and Advil without relief of symptoms.  No bowel or bladder change and no saddle paresthesias.  We referred him to Dr. Ernestina Patches for an ultrasound-guided cortisone injection to the right hip joint for possible labral tear but this provided no relief during the anesthetic phase.  No previous MRI of the lumbar spine.  His pain and weakness have progressed to the point where it is appropriate to proceed with an MRI to further evaluate for structural abnormalities.  He will follow-up with [place] once that has been completed.   Follow-Up Instructions: after MRI  Orders:  Orders Placed This Encounter  Procedures  . XR Lumbar Spine 2-3 Views  . XR HIP UNILAT W OR W/O PELVIS 2-3 VIEWS RIGHT   Meds ordered this encounter  Medications  . predniSONE (STERAPRED UNI-PAK 21 TAB) 10 MG  (21) TBPK tablet    Sig: Take as directed    Dispense:  21 tablet    Refill:  0  . methocarbamol (ROBAXIN) 500 MG tablet    Sig: Take 1 tablet (500 mg total) by mouth 2 (two) times daily as needed for muscle spasms.    Dispense:  20 tablet    Refill:  0     PMFS History: Patient Active Problem List   Diagnosis Date Noted  . Seizure disorder ([place]) 12/13/2018  . Paresthesia 12/13/2018  . Aortic insufficiency 10/25/2018  . Hyperlipidemia 05/10/2018  . Overweight (BMI 25.0-29.9) 05/10/2018  . Medication management 10/24/2017  . History of smoking 30 or more pack years 10/24/2017  . Acute right-sided low back pain without sciatica 10/05/2017  . Dysthymia 09/15/2015  . Anxiety state 12/04/2014  . Vitamin D deficiency 05/07/2014  . ADD (attention deficit disorder) 07/24/2013   Past Medical History:  Diagnosis Date  . ADD (attention deficit disorder)   . Anxiety   . Aortic insufficiency   . Pneumonia   . Tobacco abuse     Family History  Problem Relation Age of Onset  . Heart attack Maternal Grandfather        x11  . Stroke Maternal Grandfather        x2    Past Surgical History:  Procedure Laterality Date  . ADENOIDECTOMY    . SHOULDER SURGERY Right 12-08-11  . TONSILLECTOMY    . WISDOM TOOTH EXTRACTION  10/19/2017   Social History   Occupational History  . Occupation: Works in Psychologist, forensic  Tobacco Use  . Smoking status: Current Every Day Smoker    Packs/day: 2.00    Years: 15.00    Pack years: 30.00    Types: Cigarettes  . Smokeless tobacco: Never Used  Substance and Sexual Activity  . Alcohol use: Yes    Comment: rare  . Drug use: Yes    Types: Marijuana  . Sexual activity: Yes    Partners: Female

## 2019-05-17 NOTE — Telephone Encounter (Signed)
Ok, thanks.

## 2019-05-20 ENCOUNTER — Other Ambulatory Visit: Payer: Self-pay | Admitting: Adult Health Nurse Practitioner

## 2019-05-20 DIAGNOSIS — E559 Vitamin D deficiency, unspecified: Secondary | ICD-10-CM

## 2019-05-23 ENCOUNTER — Telehealth: Payer: Self-pay | Admitting: Physician Assistant

## 2019-05-23 DIAGNOSIS — F988 Other specified behavioral and emotional disorders with onset usually occurring in childhood and adolescence: Secondary | ICD-10-CM

## 2019-05-23 MED ORDER — AMPHETAMINE-DEXTROAMPHETAMINE 30 MG PO TABS: ORAL_TABLET | ORAL | 0 refills | Status: DC

## 2019-05-23 NOTE — Telephone Encounter (Signed)
-----   Message from Elenor Quinones, [place] sent at 05/22/2019  4:04 PM EDT ----- Regarding: REFILL Contact: [phone] PER PT/YELLOW NOTE:  Refill on ADDERALL Please & thank you!  Pharmacy:  [facility] [address] in [place]

## 2019-05-28 ENCOUNTER — Ambulatory Visit: Payer: BC Managed Care – PPO | Admitting: Orthopaedic Surgery

## 2019-06-06 ENCOUNTER — Telehealth: Payer: Self-pay | Admitting: Orthopaedic Surgery

## 2019-06-06 NOTE — Telephone Encounter (Signed)
Patient's wife called wanting to know what can be about his back pain while they are waiting on his MRI.  They are having trouble getting his insurance to pay for all of the MRI.  CB#[phone].  Thank you.

## 2019-06-06 NOTE — Telephone Encounter (Signed)
Does he want another steroid dose pak or tramadol?

## 2019-06-06 NOTE — Telephone Encounter (Signed)
Please advise 

## 2019-06-07 MED ORDER — TRAMADOL HCL 50 MG PO TABS: 50.0000 mg | ORAL_TABLET | Freq: Three times a day (TID) | ORAL | 2 refills | Status: DC | PRN

## 2019-06-07 NOTE — Telephone Encounter (Signed)
Tramadol please [facility] [place]; [address]

## 2019-06-16 ENCOUNTER — Other Ambulatory Visit: Payer: Self-pay | Admitting: Adult Health

## 2019-06-16 DIAGNOSIS — F411 Generalized anxiety disorder: Secondary | ICD-10-CM

## 2019-06-28 DIAGNOSIS — M5127 Other intervertebral disc displacement, lumbosacral region: Secondary | ICD-10-CM | POA: Diagnosis not present

## 2019-06-28 DIAGNOSIS — M5126 Other intervertebral disc displacement, lumbar region: Secondary | ICD-10-CM | POA: Diagnosis not present

## 2019-06-28 DIAGNOSIS — Z135 Encounter for screening for eye and ear disorders: Secondary | ICD-10-CM | POA: Diagnosis not present

## 2019-07-03 ENCOUNTER — Other Ambulatory Visit: Payer: Self-pay

## 2019-07-03 ENCOUNTER — Telehealth: Payer: Self-pay | Admitting: Physician Assistant

## 2019-07-03 ENCOUNTER — Ambulatory Visit: Payer: BC Managed Care – PPO | Admitting: Orthopaedic Surgery

## 2019-07-03 DIAGNOSIS — G8929 Other chronic pain: Secondary | ICD-10-CM

## 2019-07-03 DIAGNOSIS — M545 Low back pain: Secondary | ICD-10-CM

## 2019-07-03 DIAGNOSIS — F988 Other specified behavioral and emotional disorders with onset usually occurring in childhood and adolescence: Secondary | ICD-10-CM

## 2019-07-03 MED ORDER — AMPHETAMINE-DEXTROAMPHETAMINE 30 MG PO TABS: ORAL_TABLET | ORAL | 0 refills | Status: DC

## 2019-07-03 NOTE — Telephone Encounter (Signed)
-----   Message from Elenor Quinones, [place] sent at 07/02/2019  3:39 PM EST ----- Regarding: MED REFILL Contact: [phone] PER PT/YELLOW NOTE:  Refill on ADDERALL Please & thank you!  Pharmacy:  [facility] [address] [place]

## 2019-07-03 NOTE — Progress Notes (Signed)
Office Visit Note   Patient: William Willis           Date of Birth: 1980-11-08           MRN: [id number] Visit Date: 07/03/2019              Requested by: Lucky Cowboy, MD [address] [place],  [place] [address] PCP: Lucky Cowboy, MD   Assessment & Plan: Visit Diagnoses:  1. Chronic right-sided low back pain, unspecified whether sciatica present     Plan: MRI of the lumbar spine shows a moderate disc protrusion at L5-S1 on the right side.  He had no relief from intra-articular right hip injection.  We will refer patient to Dr. Alvester Morin for lumbar spine ESI.  Questions encouraged and answered.  Follow-up as needed.  Follow-Up Instructions: Return if symptoms worsen or fail to improve.   Orders:  Orders Placed This Encounter  Procedures  . Ambulatory referral to Physical Medicine Rehab   No orders of the defined types were placed in this encounter.     Procedures: No procedures performed   Clinical Data: No additional findings.   Subjective: Chief Complaint  Patient presents with  . Lower Back - Follow-up    MRI Review    William Willis comes back today for MRI review of his lumbar spine.  He states that he continues to have right-sided lumbar radiculopathy.  He is taking Tylenol and Goody's powder daily.   Review of Systems  Constitutional: Negative.   All other systems reviewed and are negative.    Objective: Vital Signs: There were no vitals taken for this visit.  Physical Exam Vitals signs and nursing note reviewed.  Constitutional:      Appearance: He is well-developed.  Pulmonary:     Effort: Pulmonary effort is normal.  Abdominal:     Palpations: Abdomen is soft.  Skin:    General: Skin is warm.  Neurological:     Mental Status: He is alert and oriented to person, place, and time.  Psychiatric:        Behavior: Behavior normal.        Thought Content: Thought content normal.        Judgment: Judgment normal.      Ortho Exam Lumbar spine exam shows no motor or sensory deficits.  Tenderness along the lumbar spine. Specialty Comments:  No specialty comments available.  Imaging: No results found.   PMFS History: Patient Active Problem List   Diagnosis Date Noted  . Seizure disorder ([facility]) 12/13/2018  . Paresthesia 12/13/2018  . Aortic insufficiency 10/25/2018  . Hyperlipidemia 05/10/2018  . Overweight (BMI 25.0-29.9) 05/10/2018  . Medication management 10/24/2017  . History of smoking 30 or more pack years 10/24/2017  . Acute right-sided low back pain without sciatica 10/05/2017  . Dysthymia 09/15/2015  . Anxiety state 12/04/2014  . Vitamin D deficiency 05/07/2014  . ADD (attention deficit disorder) 07/24/2013   Past Medical History:  Diagnosis Date  . ADD (attention deficit disorder)   . Anxiety   . Aortic insufficiency   . Pneumonia   . Tobacco abuse     Family History  Problem Relation Age of Onset  . Heart attack Maternal Grandfather        x11  . Stroke Maternal Grandfather        x2    Past Surgical History:  Procedure Laterality Date  . ADENOIDECTOMY    . SHOULDER SURGERY Right 12-08-11  . TONSILLECTOMY    .  WISDOM TOOTH EXTRACTION  10/19/2017   Social History   Occupational History  . Occupation: Works in Museum/gallery curator  Tobacco Use  . Smoking status: Current Every Day Smoker    Packs/day: 2.00    Years: 15.00    Pack years: 30.00    Types: Cigarettes  . Smokeless tobacco: Never Used  Substance and Sexual Activity  . Alcohol use: Yes    Comment: rare  . Drug use: Yes    Types: Marijuana  . Sexual activity: Yes    Partners: Female

## 2019-07-04 ENCOUNTER — Telehealth: Payer: Self-pay | Admitting: Orthopaedic Surgery

## 2019-07-04 NOTE — Telephone Encounter (Signed)
Patient called advised the Rx for Hydrocodone is not showing received at the pharmacy yet. The number to contact patient is [phone]

## 2019-07-05 ENCOUNTER — Other Ambulatory Visit: Payer: Self-pay | Admitting: Physician Assistant

## 2019-07-05 MED ORDER — HYDROCODONE-ACETAMINOPHEN 5-325 MG PO TABS: 1.0000 | ORAL_TABLET | Freq: Every day | ORAL | 0 refills | Status: DC | PRN

## 2019-07-05 NOTE — Telephone Encounter (Signed)
Called patient. Notified him that script has been sent to pharmacy.

## 2019-07-05 NOTE — Telephone Encounter (Signed)
Just called this in

## 2019-07-16 ENCOUNTER — Encounter: Payer: Self-pay | Admitting: Orthopaedic Surgery

## 2019-07-17 ENCOUNTER — Other Ambulatory Visit: Payer: Self-pay | Admitting: Physician Assistant

## 2019-07-17 MED ORDER — HYDROCODONE-ACETAMINOPHEN 5-325 MG PO TABS: 1.0000 | ORAL_TABLET | Freq: Every day | ORAL | 0 refills | Status: DC | PRN

## 2019-07-17 NOTE — Telephone Encounter (Signed)
I sent in one more rx.  Needs to make it last until injection

## 2019-07-24 ENCOUNTER — Encounter: Payer: Self-pay | Admitting: Physical Medicine and Rehabilitation

## 2019-07-25 ENCOUNTER — Telehealth: Payer: Self-pay | Admitting: Orthopaedic Surgery

## 2019-07-25 NOTE — Telephone Encounter (Signed)
Patient's mother Andrea's called and stated that Rx was to be faxed to [facility], She has not heard  Back from anyone.  Please call patient mother Seth Bake and advise  [phone]

## 2019-07-25 NOTE — Telephone Encounter (Signed)
Have you resent this yet.

## 2019-07-26 ENCOUNTER — Other Ambulatory Visit: Payer: Self-pay | Admitting: Physician Assistant

## 2019-07-26 MED ORDER — HYDROCODONE-ACETAMINOPHEN 5-325 MG PO TABS: 1.0000 | ORAL_TABLET | Freq: Every day | ORAL | 0 refills | Status: DC | PRN

## 2019-07-26 NOTE — Telephone Encounter (Signed)
Just sent in again

## 2019-07-26 NOTE — Telephone Encounter (Signed)
Called to advise him on rx. Should  Be at the pharm. If not they will cb

## 2019-07-26 NOTE — Telephone Encounter (Signed)
Just sent in again

## 2019-07-29 ENCOUNTER — Other Ambulatory Visit: Payer: Self-pay | Admitting: Adult Health

## 2019-07-29 DIAGNOSIS — M545 Low back pain, unspecified: Secondary | ICD-10-CM

## 2019-08-01 ENCOUNTER — Ambulatory Visit: Payer: BC Managed Care – PPO | Admitting: Physical Medicine and Rehabilitation

## 2019-08-01 ENCOUNTER — Other Ambulatory Visit: Payer: Self-pay

## 2019-08-01 ENCOUNTER — Encounter: Payer: Self-pay | Admitting: Physical Medicine and Rehabilitation

## 2019-08-01 ENCOUNTER — Ambulatory Visit: Payer: Self-pay

## 2019-08-01 VITALS — BP 132/83 | HR 79

## 2019-08-01 DIAGNOSIS — M5416 Radiculopathy, lumbar region: Secondary | ICD-10-CM | POA: Diagnosis not present

## 2019-08-01 MED ORDER — DEXAMETHASONE SODIUM PHOSPHATE 10 MG/ML IJ SOLN
15.0000 mg | Freq: Once | INTRAMUSCULAR | Status: AC
  Administered 2019-08-01: 15 mg

## 2019-08-01 NOTE — Procedures (Signed)
Lumbosacral Transforaminal Epidural Steroid Injection - Sub-Pedicular Approach with Fluoroscopic Guidance  Patient: William Willis      Date of Birth: Mar 19, 1981 MRN: [id number] PCP: Unk Pinto, MD      Visit Date: 08/01/2019   Universal Protocol:    Date/Time: 08/01/2019  Consent Given By: the patient  Position: PRONE  Additional Comments: Vital signs were monitored before and after the procedure. Patient was prepped and draped in the usual sterile fashion. The correct patient, procedure, and site was verified.   Injection Procedure Details:  Procedure Site One Meds Administered:  Meds ordered this encounter  Medications  . dexamethasone (DECADRON) injection 15 mg    Laterality: Right  Location/Site:  L2-L3  Needle size: 73 G  Needle type: Spinal  Needle Placement: Transforaminal  Findings:    -Comments: Excellent flow of contrast along the nerve and into the epidural space.  Procedure Details: After squaring off the end-plates to get a true AP view, the C-arm was positioned so that an oblique view of the foramen as noted above was visualized. The target area is just inferior to the "nose of the scotty dog" or sub pedicular. The soft tissues overlying this structure were infiltrated with 2-3 ml. of 1% Lidocaine without Epinephrine.  The spinal needle was inserted toward the target using a "trajectory" view along the fluoroscope beam.  Under AP and lateral visualization, the needle was advanced so it did not puncture dura and was located close the 6 O'Clock position of the pedical in AP tracterory. Biplanar projections were used to confirm position. Aspiration was confirmed to be negative for CSF and/or blood. A 1-2 ml. volume of Isovue-250 was injected and flow of contrast was noted at each level. Radiographs were obtained for documentation purposes.   After attaining the desired flow of contrast documented above, a 0.5 to 1.0 ml test dose of 0.25% Marcaine  was injected into each respective transforaminal space.  The patient was observed for 90 seconds post injection.  After no sensory deficits were reported, and normal lower extremity motor function was noted,   the above injectate was administered so that equal amounts of the injectate were placed at each foramen (level) into the transforaminal epidural space.   Additional Comments:  The patient tolerated the procedure well Dressing: 2 x 2 sterile gauze and Band-Aid    Post-procedure details: Patient was observed during the procedure. Post-procedure instructions were reviewed.  Patient left the clinic in stable condition.

## 2019-08-01 NOTE — Progress Notes (Signed)
 .  Numeric Pain Rating Scale and Functional Assessment Average Pain 6   In the last MONTH (on 0-10 scale) has pain interfered with the following?  1. General activity like being  able to carry out your everyday physical activities such as walking, climbing stairs, carrying groceries, or moving a chair?  Rating(1)   +Driver, -BT, -Dye Allergies.  

## 2019-08-02 NOTE — Progress Notes (Signed)
William Willis - 38 y.o. male MRN [id number]  Date of birth: 1980/10/04  Office Visit Note: Visit Date: 08/01/2019 PCP: William Cowboy, MD Referred by: William Cowboy, MD  Subjective: Chief Complaint  Patient presents with  . Lower Back - Pain   HPI:  William Willis is a 38 y.o. male who comes in today At the request of Dr. Glee Willis for lumbar epidural injection.  Patient has lumbar MRI showing disc protrusion which is right paracentral at L1-2 which could affect the L2 nerve root with no frank compression.  Also central to right protrusion at L5-S1 could irritate S1 nerve root.  His symptoms are more consistent with L2 radicular pain into the right anterior thigh.  He changes a deep toothache pain.  Nothing seems to help at this point.  He has failed all other conservative care at this point.  He is on pain medication.  If he gets some relief with the L2 injection it was still having more pain down the leg would consider S1 injection.  If results are very short-term or not long-lasting patient would probably be a good candidate for microdiscectomy.  ROS Otherwise per HPI.  Assessment & Plan: Visit Diagnoses:  1. Lumbar radiculopathy     Plan: No additional findings.   Meds & Orders:  Meds ordered this encounter  Medications  . dexamethasone (DECADRON) injection 15 mg    Orders Placed This Encounter  Procedures  . XR C-ARM NO REPORT  . Epidural Steroid injection    Follow-up: Return if symptoms worsen or fail to improve.   Procedures: No procedures performed  Lumbosacral Transforaminal Epidural Steroid Injection - Sub-Pedicular Approach with Fluoroscopic Guidance  Patient: William Willis      Date of Birth: 1981-05-22 MRN: [id number] PCP: William Cowboy, MD      Visit Date: 08/01/2019   Universal Protocol:    Date/Time: 08/01/2019  Consent Given By: the patient  Position: PRONE  Additional Comments: Vital signs were monitored before and after  the procedure. Patient was prepped and draped in the usual sterile fashion. The correct patient, procedure, and site was verified.   Injection Procedure Details:  Procedure Site One Meds Administered:  Meds ordered this encounter  Medications  . dexamethasone (DECADRON) injection 15 mg    Laterality: Right  Location/Site:  L2-L3  Needle size: 22 G  Needle type: Spinal  Needle Placement: Transforaminal  Findings:    -Comments: Excellent flow of contrast along the nerve and into the epidural space.  Procedure Details: After squaring off the end-plates to get a true AP view, the C-arm was positioned so that an oblique view of the foramen as noted above was visualized. The target area is just inferior to the "nose of the scotty dog" or sub pedicular. The soft tissues overlying this structure were infiltrated with 2-3 ml. of 1% Lidocaine without Epinephrine.  The spinal needle was inserted toward the target using a "trajectory" view along the fluoroscope beam.  Under AP and lateral visualization, the needle was advanced so it did not puncture dura and was located close the 6 O'Clock position of the pedical in AP tracterory. Biplanar projections were used to confirm position. Aspiration was confirmed to be negative for CSF and/or blood. A 1-2 ml. volume of Isovue-250 was injected and flow of contrast was noted at each level. Radiographs were obtained for documentation purposes.   After attaining the desired flow of contrast documented above, a 0.5 to 1.0 ml test dose  of 0.25% Marcaine was injected into each respective transforaminal space.  The patient was observed for 90 seconds post injection.  After no sensory deficits were reported, and normal lower extremity motor function was noted,   the above injectate was administered so that equal amounts of the injectate were placed at each foramen (level) into the transforaminal epidural space.   Additional Comments:  The patient tolerated  the procedure well Dressing: 2 x 2 sterile gauze and Band-Aid    Post-procedure details: Patient was observed during the procedure. Post-procedure instructions were reviewed.  Patient left the clinic in stable condition.     Clinical History: Acute Interface, Incoming Rad Results - 07/01/2019 10:33 AM EST MRI lumbar spine:  TECHNIQUE: Sagittal and axial T1 and T2-weighted sequences were performed. Additional sagittal STIR images were performed.  INDICATION: Right-sided low back pain  COMPARISON: None  FINDINGS: #  Lumbar alignment is preserved.  #  Vertebral body heights are well maintained.  #  The marrow signal intensity is normal.  #  Conus terminates at L1 without evidence of tethering. #  Nerve roots appear normal.  #  Incidental findings: None.   #  L1-2: Disc desiccation. Disc bulge with right paracentral protrusion causing mild central canal and right lateral recess stenosis. Neuroforamina are patent. #   #  L2-3: Disc desiccation. There is disc bulge without significant central canal stenosis or neural foraminal narrowing #   #  L3-4: Normal. #   #  L4-5: Normal. #   #  L5-S1: Disc desiccation. Posterior protrusion slightly asymmetric to the right which may be abutting the transversing S1 nerve root. No significant central canal stenosis.   IMPRESSION:  1.  L1-2 right paracentral protrusion causes mild central canal and right lateral recess stenosis.  2.  L5-S1 posterior protrusion slightly and asymmetric to the right may be abutting the transversing S1 nerve. No significant central canal stenosis.  Electronically Signed by: William Willis     Objective:  VS:  HT:    WT:   BMI:     BP:132/83  HR:79bpm  TEMP: ( )  RESP:  Physical Exam  Ortho Exam Imaging: XR C-ARM NO REPORT  Result Date: 08/01/2019 Please see Notes tab for imaging impression.

## 2019-08-08 ENCOUNTER — Telehealth: Payer: Self-pay | Admitting: Physician Assistant

## 2019-08-08 DIAGNOSIS — F988 Other specified behavioral and emotional disorders with onset usually occurring in childhood and adolescence: Secondary | ICD-10-CM

## 2019-08-08 MED ORDER — AMPHETAMINE-DEXTROAMPHETAMINE 30 MG PO TABS: ORAL_TABLET | ORAL | 0 refills | Status: DC

## 2019-08-08 NOTE — Telephone Encounter (Signed)
-----   Message from Elenor Quinones, [place] sent at 08/07/2019  2:08 PM EST ----- Regarding: med refill Contact: [phone] PER PT/YELLOW NOTE:  Refill on ADDERALL Please & thank you!  Pharmacy:  [facility]/ [address]  Linna Hoff

## 2019-08-26 ENCOUNTER — Other Ambulatory Visit: Payer: Self-pay | Admitting: Adult Health Nurse Practitioner

## 2019-08-26 DIAGNOSIS — E559 Vitamin D deficiency, unspecified: Secondary | ICD-10-CM

## 2019-09-09 ENCOUNTER — Other Ambulatory Visit: Payer: Self-pay | Admitting: Internal Medicine

## 2019-09-09 DIAGNOSIS — F411 Generalized anxiety disorder: Secondary | ICD-10-CM

## 2019-09-09 MED ORDER — BUSPIRONE HCL 30 MG PO TABS: ORAL_TABLET | ORAL | 0 refills | Status: DC

## 2019-09-16 ENCOUNTER — Other Ambulatory Visit: Payer: Self-pay | Admitting: Internal Medicine

## 2019-09-16 DIAGNOSIS — F988 Other specified behavioral and emotional disorders with onset usually occurring in childhood and adolescence: Secondary | ICD-10-CM

## 2019-09-16 MED ORDER — AMPHETAMINE-DEXTROAMPHETAMINE 30 MG PO TABS: ORAL_TABLET | ORAL | 0 refills | Status: DC

## 2019-10-13 ENCOUNTER — Other Ambulatory Visit: Payer: Self-pay | Admitting: Physician Assistant

## 2019-10-13 DIAGNOSIS — M545 Low back pain, unspecified: Secondary | ICD-10-CM

## 2019-10-14 ENCOUNTER — Other Ambulatory Visit: Payer: Self-pay | Admitting: Internal Medicine

## 2019-10-15 ENCOUNTER — Encounter: Payer: Self-pay | Admitting: Adult Health

## 2019-10-15 ENCOUNTER — Ambulatory Visit: Payer: BC Managed Care – PPO | Admitting: Adult Health

## 2019-10-15 ENCOUNTER — Other Ambulatory Visit: Payer: Self-pay

## 2019-10-15 VITALS — BP 130/78 | HR 79 | Temp 97.5°F | Wt 161.0 lb

## 2019-10-15 DIAGNOSIS — F331 Major depressive disorder, recurrent, moderate: Secondary | ICD-10-CM | POA: Diagnosis not present

## 2019-10-15 DIAGNOSIS — F32A Depression, unspecified: Secondary | ICD-10-CM | POA: Insufficient documentation

## 2019-10-15 DIAGNOSIS — F321 Major depressive disorder, single episode, moderate: Secondary | ICD-10-CM | POA: Diagnosis not present

## 2019-10-15 MED ORDER — CARIPRAZINE HCL 1.5 MG PO CAPS: 1.5000 mg | ORAL_CAPSULE | Freq: Every day | ORAL | 0 refills | Status: DC

## 2019-10-15 NOTE — Patient Instructions (Addendum)
Please go to the ER or call 911 if you have any thoughts about harming yourself or anyone else   Cariprazine oral capsules What is this medicine? CARIPRAZINE (car i PRA zeen) is an antipsychotic. It is used to treat schizophrenia or bipolar disorder. Bipolar disorder is also known as manic-depression. This medicine may be used for other purposes; ask your health care provider or pharmacist if you have questions. COMMON BRAND NAME(S): VRAYLAR What should I tell my health care provider before I take this medicine? They need to know if you have any of these conditions:  dementia  diabetes  difficulty swallowing  heart disease  history of breast cancer  kidney disease  liver disease  low blood counts, like low white cell, platelet, or red cell counts  low blood pressure  Parkinson's disease  seizures  suicidal thoughts, plans, or attempt; a previous suicide attempt by you or a family member  an unusual or allergic reaction to cariprazine, other medicines, foods, dyes, or preservatives  pregnant or trying to get pregnant  breast-feeding How should I use this medicine? Take this medicine by mouth with a glass of water. Follow the directions on the prescription label. You may take it with or without food. Take your medicine at regular intervals. Do not take it more often than directed. Do not stop taking except on your doctor's advice. Talk to your pediatrician regarding the use of this medicine in children. Special care may be needed. Overdosage: If you think you have taken too much of this medicine contact a poison control center or emergency room at once. NOTE: This medicine is only for you. Do not share this medicine with others. What if I miss a dose? If you miss a dose, take it as soon as you can. If it is almost time for your next dose, take only that dose. Do not take double or extra doses. What may interact with this medicine? Do not take this medicine with any  of the following medications:  metoclopramide This medicine may also interact with the following medications:  carbamazepine  certain medicines for depression, anxiety, or psychotic disturbances  certain medicines for sleep  itraconazole  ketoconazole  medicines for blood pressure  rifampin  St John's wort This list may not describe all possible interactions. Give your health care provider a list of all the medicines, herbs, non-prescription drugs, or dietary supplements you use. Also tell them if you smoke, drink alcohol, or use illegal drugs. Some items may interact with your medicine. What should I watch for while using this medicine? Visit your health care professional for regular checks on your progress. Tell your health care professional if symptoms do not start to get better or if they get worse. Do not stop taking except on your health care professional's advice. You may develop a severe reaction. Your health care professional will tell you how much medicine to take. Patients and their families should watch out for new or worsening depression or thoughts of suicide. Also watch out for sudden changes in feelings such as feeling anxious, agitated, panicky, irritable, hostile, aggressive, impulsive, severely restless, overly excited and hyperactive, or not being able to sleep. If this happens, especially at the beginning of treatment or after a change in dose, call your healthcare professional. William Willis may get dizzy or drowsy. Do not drive, use machinery, or do anything that needs mental alertness until you know how this medicine affects you. Do not stand or sit up quickly, especially if  you are an older patient. This reduces the risk of dizzy or fainting spells. Alcohol may interfere with the effect of this medicine. Avoid alcoholic drinks. This medicine may cause dry eyes and blurred vision. If you wear contact lenses you may feel some discomfort. Lubricating drops may help. See your eye  doctor if the problem does not go away or is severe. This medicine may increase blood sugar. Ask your health care provider if changes in diet or medicines are needed if you have diabetes. This drug can cause problems with controlling your body temperature. It can lower the response of your body to cold temperatures. If possible, stay indoors during cold weather. If you must go outdoors, wear warm clothes. It can also lower the response of your body to heat. Do not overheat. Do not over-exercise. Stay out of the sun when possible. If you must be in the sun, wear cool clothing. Drink plenty of water. If you have trouble controlling your body temperature, call your health care provider right away. Women should inform their doctor if they wish to become pregnant or think they might be pregnant. The effects of this medicine on an unborn child are not known. A registry is available to monitor pregnancy outcomes in pregnant women exposed to this medicine or similar medicines. Talk to your health care professional or pharmacist for more information. What side effects may I notice from receiving this medicine? Side effects that you should report to your doctor or health care professional as soon as possible:  allergic reactions like skin rash, itching or hives, swelling of the face, lips, or tongue  breathing problems  confusion  fever or chills, sore throat  inability to keep still  problems with balance, talking, walking  redness, blistering, peeling or loosening of the skin, including inside the mouth  seizures  signs and symptoms of high blood sugar such as being more thirsty or hungry or having to urinate more than normal. You may also feel very tired or have blurry vision  signs and symptoms of low blood pressure like dizziness; feeling faint or lightheaded, falls; unusually weak or tired  signs and symptoms of neuroleptic malignant syndrome such as confusion; fast or irregular heartbeat;  high fever; increased sweating; stiff muscles  sudden numbness or weakness of the face, arm, or leg  suicidal thoughts or other mood changes  trouble swallowing  uncontrollable movements of the arms, face, head, mouth, neck, or upper body Side effects that usually do not require medical attention (report to your doctor or health care professional if they continue or are bothersome):  constipation  drowsiness  nausea, vomiting  upset stomach  weight gain This list may not describe all possible side effects. Call your doctor for medical advice about side effects. You may report side effects to FDA at 1-800-FDA-1088. Where should I keep my medicine? Keep out of the reach of children. Store at room temperature between 15 and 30 degrees C (59 and 86 degrees F). Protect from light. Throw away any unused medicine after the expiration date. NOTE: This sheet is a summary. It may not cover all possible information. If you have questions about this medicine, talk to your doctor, pharmacist, or health care provider.  2020 Elsevier/Gold Standard (2019-06-04 16:21:01)   . Major Depressive Disorder, Adult Major depressive disorder (MDD) is a mental health condition. MDD often makes you feel sad, hopeless, or helpless. MDD can also cause symptoms in your body. MDD can affect your:  Work.  School.  Relationships.  Other normal activities. MDD can range from mild to very bad. It may occur once (single episode MDD). It can also occur many times (recurrent MDD). The main symptoms of MDD often include:  Feeling sad, depressed, or irritable most of the time.  Loss of interest. MDD symptoms also include:  Sleeping too much or too little.  Eating too much or too little.  A change in your weight.  Feeling tired (fatigue) or having low energy.  Feeling worthless.  Feeling guilty.  Trouble making decisions.  Trouble thinking clearly.  Thoughts of suicide or harming  others.  Feeling weak.  Feeling agitated.  Keeping yourself from being around other people (isolation). Follow these instructions at home: Activity  Do these things as told by your doctor: ? Go back to your normal activities. ? Exercise regularly. ? Spend time outdoors. Alcohol  Talk with your doctor about how alcohol can affect your antidepressant medicines.  Do not drink alcohol. Or, limit how much alcohol you drink. ? This means no more than 1 drink a day for nonpregnant women and 2 drinks a day for men. One drink equals one of these:  12 oz of beer.  5 oz of wine.  1 oz of hard liquor. General instructions  Take over-the-counter and prescription medicines only as told by your doctor.  Eat a healthy diet.  Get plenty of sleep.  Find activities that you enjoy. Make time to do them.  Think about joining a support group. Your doctor may be able to suggest a group for you.  Keep all follow-up visits as told by your doctor. This is important. Where to find more information:  The [employer] on Mental Illness: ? www.nami.org  [place] [employer] of Mental Health: ? http://www.maynard.net/  National Suicide Prevention Lifeline: ? (213) 562-3063. This is free, 24-hour help. Contact a doctor if:  Your symptoms get worse.  You have new symptoms. Get help right away if:  You self-harm.  You see, hear, taste, smell, or feel things that are not present (hallucinate). If you ever feel like you may hurt yourself or others, or have thoughts about taking your own life, get help right away. You can go to your nearest emergency department or call:  Your local emergency services (911 in the [place]  A suicide crisis helpline, such as the National Suicide Prevention Lifeline: ? 640-192-0004. This is open 24 hours a day. This information is not intended to replace advice given to you by your health care provider. Make sure you discuss any questions you have with your  health care provider. Document Revised: 07/21/2017 Document Reviewed: 04/24/2016 Elsevier Patient Education  2020 [employer].
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
## 2019-10-25 ENCOUNTER — Other Ambulatory Visit: Payer: Self-pay

## 2019-10-25 DIAGNOSIS — F988 Other specified behavioral and emotional disorders with onset usually occurring in childhood and adolescence: Secondary | ICD-10-CM

## 2019-10-25 MED ORDER — AMPHETAMINE-DEXTROAMPHETAMINE 30 MG PO TABS: ORAL_TABLET | ORAL | 0 refills | Status: DC

## 2019-10-25 NOTE — Telephone Encounter (Signed)
Refill request for Adderall. In que, for your review.  

## 2019-10-26 ENCOUNTER — Other Ambulatory Visit: Payer: Self-pay | Admitting: Internal Medicine

## 2019-10-26 DIAGNOSIS — F988 Other specified behavioral and emotional disorders with onset usually occurring in childhood and adolescence: Secondary | ICD-10-CM

## 2019-10-26 MED ORDER — AMPHETAMINE-DEXTROAMPHETAMINE 30 MG PO TABS: ORAL_TABLET | ORAL | 0 refills | Status: DC

## 2019-10-28 ENCOUNTER — Encounter: Payer: Self-pay | Admitting: Adult Health

## 2019-11-14 ENCOUNTER — Telehealth: Payer: Self-pay

## 2019-11-14 NOTE — Telephone Encounter (Signed)
Patient is requesting something for "stamina". States that you discussed this with him at his last office visit.

## 2019-11-15 NOTE — Telephone Encounter (Signed)
Requesting something "to be able to last in the bedroom". Also, states that the Vraylar, he cannot take, made him very sleepy all the time. Would like something else. Please advise.

## 2019-11-18 ENCOUNTER — Other Ambulatory Visit: Payer: Self-pay | Admitting: Adult Health

## 2019-11-18 MED ORDER — TADALAFIL 10 MG PO TABS: 10.0000 mg | ORAL_TABLET | ORAL | 0 refills | Status: DC | PRN

## 2019-11-18 NOTE — Telephone Encounter (Signed)
Patient advised.

## 2019-11-18 NOTE — Telephone Encounter (Signed)
Patient states that he would like a prescription for Cialis or Viagra, states that he needs it for an erection and keeping that.  Patient only sees a therapist about once a month but is willing to see a psychiatrist, please refer him.

## 2019-11-26 DIAGNOSIS — R0789 Other chest pain: Secondary | ICD-10-CM | POA: Diagnosis not present

## 2019-11-26 DIAGNOSIS — S39012A Strain of muscle, fascia and tendon of lower back, initial encounter: Secondary | ICD-10-CM | POA: Diagnosis not present

## 2019-11-26 DIAGNOSIS — S60221A Contusion of right hand, initial encounter: Secondary | ICD-10-CM | POA: Diagnosis not present

## 2019-11-27 ENCOUNTER — Ambulatory Visit: Payer: BC Managed Care – PPO | Admitting: Adult Health

## 2019-11-27 ENCOUNTER — Other Ambulatory Visit: Payer: Self-pay

## 2019-11-27 ENCOUNTER — Encounter: Payer: Self-pay | Admitting: Adult Health

## 2019-11-27 VITALS — BP 130/90 | HR 92 | Temp 97.9°F | Wt 159.0 lb

## 2019-11-27 DIAGNOSIS — F321 Major depressive disorder, single episode, moderate: Secondary | ICD-10-CM

## 2019-11-27 DIAGNOSIS — F988 Other specified behavioral and emotional disorders with onset usually occurring in childhood and adolescence: Secondary | ICD-10-CM | POA: Diagnosis not present

## 2019-11-27 DIAGNOSIS — F32A Depression, unspecified: Secondary | ICD-10-CM

## 2019-11-27 MED ORDER — CARIPRAZINE HCL 1.5 MG PO CAPS: 1.5000 mg | ORAL_CAPSULE | Freq: Every day | ORAL | 0 refills | Status: DC

## 2019-11-27 MED ORDER — AMPHETAMINE-DEXTROAMPHETAMINE 30 MG PO TABS: ORAL_TABLET | ORAL | 0 refills | Status: DC
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
## 2019-12-03 ENCOUNTER — Other Ambulatory Visit: Payer: Self-pay

## 2019-12-03 DIAGNOSIS — M545 Low back pain, unspecified: Secondary | ICD-10-CM

## 2019-12-03 MED ORDER — GABAPENTIN 300 MG PO CAPS: ORAL_CAPSULE | ORAL | 3 refills | Status: DC

## 2019-12-15 ENCOUNTER — Other Ambulatory Visit: Payer: Self-pay | Admitting: Neurology

## 2019-12-18 ENCOUNTER — Other Ambulatory Visit: Payer: Self-pay

## 2019-12-18 DIAGNOSIS — F411 Generalized anxiety disorder: Secondary | ICD-10-CM

## 2019-12-18 NOTE — Telephone Encounter (Signed)
Cialis and Buspar refill request. In que, for your review.

## 2019-12-19 MED ORDER — TADALAFIL 10 MG PO TABS: 10.0000 mg | ORAL_TABLET | ORAL | 0 refills | Status: DC | PRN

## 2019-12-19 MED ORDER — BUSPIRONE HCL 30 MG PO TABS: ORAL_TABLET | ORAL | 0 refills | Status: DC

## 2019-12-23 ENCOUNTER — Other Ambulatory Visit: Payer: Self-pay | Admitting: Adult Health

## 2019-12-23 ENCOUNTER — Telehealth: Payer: Self-pay

## 2019-12-23 DIAGNOSIS — F411 Generalized anxiety disorder: Secondary | ICD-10-CM

## 2019-12-23 DIAGNOSIS — F988 Other specified behavioral and emotional disorders with onset usually occurring in childhood and adolescence: Secondary | ICD-10-CM

## 2019-12-23 MED ORDER — AMPHETAMINE-DEXTROAMPHETAMINE 30 MG PO TABS: ORAL_TABLET | ORAL | 0 refills | Status: DC

## 2019-12-23 MED ORDER — BUSPIRONE HCL 30 MG PO TABS: ORAL_TABLET | ORAL | 0 refills | Status: DC

## 2019-12-23 MED ORDER — CARBAMAZEPINE ER 400 MG PO TB12: ORAL_TABLET | ORAL | 0 refills | Status: DC

## 2019-12-23 NOTE — Telephone Encounter (Signed)
-----   Message from Judd Gaudier, NP sent at 12/23/2019  1:15 PM EDT ----- Regarding: RE: OFFICE NOTE/PHONE CALL Contact: [phone] Please advise refilled tegretol and buspar to [facility] pharmacy on file in [place]. Please advise due to controlled substance cannot refill adderall sooner than 5/8 regardless of circumstances, but sent in postdated to be filled on the first day possible this Saturday. Thanks! ----- Message ----- From: Gregery Na, CMA Sent: 12/23/2019  12:26 PM EDT To: Judd Gaudier, NP Subject: OFFICE NOTE/PHONE CALL                         PER OFFICE NOTE/CALL:                      Was working in [employer]  & accidentally left his meds at the [facility].  ----TEGRETOL ----ADDERALL ----BUSPAR  Would like you to refill this due to leaving MEDS in [place]   PER PATIENT: please advise...Marland KitchenMarland Kitchen

## 2019-12-23 NOTE — Telephone Encounter (Signed)
Unable to reach patient due to mailbox being FULL.

## 2020-01-08 ENCOUNTER — Ambulatory Visit: Payer: BC Managed Care – PPO | Admitting: Cardiovascular Disease

## 2020-01-08 NOTE — Progress Notes (Deleted)
No chief complaint on file.   History of Present Illness: 39 yo male with history of anxiety, ADD, chest pain, tobacco abuse, anxiety and aortic valve insufficiency here today for follow up. I met him in February 2020 as a new patient for the evaluation of aortic valve insufficiency. He was admitted to [facility] in December 2019 with c/o chest pain, left sided numbness and was found to have aortic valve insufficiency. Negative neurology workup. Brain MRI negative for stroke. Echo December 2019 at [facility] showed LVEF=55-60% with moderate aortic valve regurgitation. Cardiac enzymes negative. At his first visit here in 2020 he described chest pain several times per week with no associated symptoms and no exertional pain. He was smoking 2 ppd. We arranged a cardiac CTA but he missed two appointments for the study and then did not follow up to reschedule.   He is here today for follow up. The patient denies any chest pain, dyspnea, palpitations, lower extremity edema, orthopnea, PND, dizziness, near syncope or syncope.    Primary Care Physician: Unk Pinto, MD  Past Medical History:  Diagnosis Date  . ADD (attention deficit disorder)   . Anxiety   . Aortic insufficiency   . Pneumonia   . Tobacco abuse     Past Surgical History:  Procedure Laterality Date  . ADENOIDECTOMY    . SHOULDER SURGERY Right 12-08-11  . TONSILLECTOMY    . WISDOM TOOTH EXTRACTION  10/19/2017    Current Outpatient Medications  Medication Sig Dispense Refill  . amphetamine-dextroamphetamine (ADDERALL) 30 MG tablet Take 1/2 to 1or 2 tablets /day & please try to limit to 5 days /week (10 tab/week) to avoid addiction. 60 tablet 0  . busPIRone (BUSPAR) 30 MG tablet Take 1/2 to 1 tablet 2 x /day for Anxiety 180 tablet 0  . carbamazepine (TEGRETOL XR) 400 MG 12 hr tablet TAKE 1 TABLET(400 MG) BY MOUTH TWICE DAILY 60 tablet 0  . cariprazine (VRAYLAR) capsule Take 1 capsule (1.5 mg total) by mouth daily. Take  in the evening. 14 capsule 0  . gabapentin (NEURONTIN) 300 MG capsule Take 1 to 2 capsules up to 3 x /day if needed for Nerve Pain 180 capsule 3  . tadalafil (CIALIS) 10 MG tablet Take 1 tablet (10 mg total) by mouth as needed for erectile dysfunction. 20 tablet 0  . Vitamin D, Ergocalciferol, (DRISDOL) 1.25 MG (50000 UT) CAPS capsule TAKE 1 CAPSULE BY MOUTH 3 DAYS A WEEK FOR VITAMIN DAILY DEFICIENCY 36 capsule 1   No current facility-administered medications for this visit.    No Known Allergies  Social History   Socioeconomic History  . Marital status: Married    Spouse name: Not on file  . Number of children: 2  . Years of education: 79  . Highest education level: High school graduate  Occupational History  . Occupation: Works in Museum/gallery curator  Tobacco Use  . Smoking status: Current Every Day Smoker    Packs/day: 2.00    Years: 15.00    Pack years: 30.00    Types: Cigarettes  . Smokeless tobacco: Never Used  Substance and Sexual Activity  . Alcohol use: Yes    Comment: rare  . Drug use: Yes    Types: Marijuana  . Sexual activity: Yes    Partners: Female  Other Topics Concern  . Not on file  Social History Narrative  . Not on file   Social Determinants of Health   Financial Resource Strain:   .  Difficulty of Paying Living Expenses:   Food Insecurity:   . Worried About Charity fundraiser in the Last Year:   . Arboriculturist in the Last Year:   Transportation Needs:   . Film/video editor (Medical):   Marland Kitchen Lack of Transportation (Non-Medical):   Physical Activity:   . Days of Exercise per Week:   . Minutes of Exercise per Session:   Stress:   . Feeling of Stress :   Social Connections:   . Frequency of Communication with Friends and Family:   . Frequency of Social Gatherings with Friends and Family:   . Attends Religious Services:   . Active Member of Clubs or Organizations:   . Attends Archivist Meetings:   Marland Kitchen Marital Status:   Intimate  Partner Violence:   . Fear of Current or Ex-Partner:   . Emotionally Abused:   Marland Kitchen Physically Abused:   . Sexually Abused:     Family History  Problem Relation Age of Onset  . Heart attack Maternal Grandfather        x11  . Stroke Maternal Grandfather        x2    Review of Systems:  As stated in the HPI and otherwise negative.   There were no vitals taken for this visit.  Physical Examination: General: Well developed, well nourished, NAD  HEENT: OP clear, mucus membranes moist  SKIN: warm, dry. No rashes. Neuro: No focal deficits  Musculoskeletal: Muscle strength 5/5 all ext  Psychiatric: Mood and affect normal  Neck: No JVD, no carotid bruits, no thyromegaly, no lymphadenopathy.  Lungs:Clear bilaterally, no wheezes, rhonci, crackles Cardiovascular: Regular rate and rhythm. No murmurs, gallops or rubs. Abdomen:Soft. Bowel sounds present. Non-tender.  Extremities: No lower extremity edema. Pulses are 2 + in the bilateral DP/PT.  EKG:  EKG is *** ordered today. The ekg ordered today demonstrates    Recent Labs: No results found for requested labs within last 8760 hours.   Lipid Panel    Component Value Date/Time   CHOL 201 (H) 10/26/2018 1008   TRIG 151 (H) 10/26/2018 1008   HDL 42 10/26/2018 1008   CHOLHDL 4.8 10/26/2018 1008   LDLCALC 133 (H) 10/26/2018 1008     Wt Readings from Last 3 Encounters:  11/27/19 159 lb (72.1 kg)  10/15/19 161 lb (73 kg)  05/07/19 163 lb (73.9 kg)     Other studies Reviewed: Additional studies/ records that were reviewed today include: I reviewed all of the records from [facility] from December 2019 admission including chest CTA and echo reports.  Review of the above records demonstrates:   Assessment and Plan:   1. Chest pain: *** ? Cardiac CTA  2. Aortic insufficiency: Echo in December 2019 at [place] exclude bicuspid aortic valve. AI is moderate per report. LV function is normal. *** Repeat echo now.    Current medicines are reviewed at length with the patient today.  The patient does not have concerns regarding medicines.  The following changes have been made:  no change  Labs/ tests ordered today include:   No orders of the defined types were placed in this encounter.    Disposition:   FU with me in 6-8 months after echo.    Signed, Lauree Chandler, MD 01/08/2020 6:18 AM    [address], [place], [place]  [address] Phone: [phone]; Fax: [phone]

## 2020-01-13 ENCOUNTER — Telehealth: Payer: Self-pay | Admitting: Neurology
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
## 2020-01-14 ENCOUNTER — Other Ambulatory Visit: Payer: Self-pay | Admitting: Neurology

## 2020-01-22 DIAGNOSIS — F331 Major depressive disorder, recurrent, moderate: Secondary | ICD-10-CM | POA: Diagnosis not present

## 2020-01-28 NOTE — Progress Notes (Signed)
Complete Physical  Assessment and Plan:  William Willis was seen today for annual exam.  Diagnoses and all orders for this visit:  Encounter for annual physical exam Health Maintenance- Discussed STD testing, safe sex, alcohol and drug awareness, drinking and driving dangers, wearing a seat belt and general safety measures for young adult.  Attention deficit disorder (ADD) without hyperactivity Doing well on current regimen, continue with benefit Refill sent electronically.  Aortic valve insufficiency, etiology of cardiac valve disease unspecified Following with cardiology - needs CTA and follow up ECHO, patient is deferring at this time due to family crisis, will continue to monitor and encouraged follow up with cardiology at the soonest opportunity. Denies concerning sx at this time -   Mixed hyperlipidemia Ideal LDL <70 due to smoking and risk factors; plan to start rosuvastatin pending labs -  Discussed dietary and exercise modificaitons -     Lipid panel  Numbness and tingling of both upper extremities Discussed monitoring symptoms Has been a chronic issue for him Established with neurology   History of smoking 30 or more pack years Discussed this at length, risks Not ready to quit, declines meds Will continue to assess readiness  Marijuana smoker Advised to reduce/quit, monitor   Anxiety state Fairly controlled with current meds at this time, newly established with psch Stress management techniques discussed, increase water, good sleep hygiene discussed, increase exercise, and increase veggies.   Seizure disorder (HCC) Check tegretol levels; monitor retics, iron Advised needs eye exams regularly with this medication Advised schedule follow up with Dr. Terrace Arabia  Back pain  Follow up for injections  Vitamin D deficiency Continue supplementation -     VITAMIN D 25 Hydroxy (Vit-D Deficiency)  Overweight (BMI 25.0-29.9)  Discussed dietary and exrcise modifications -      Hemoglobin A1c -     Insulin, random  Orders Placed This Encounter  Procedures  . CBC with Differential/Platelet  . COMPLETE METABOLIC PANEL WITH GFR  . Magnesium  . Lipid panel  . TSH  . VITAMIN D 25 Hydroxy (Vit-D Deficiency, Fractures)  . Urinalysis, Routine w reflex microscopic  . Carbamazepine level, total  . Hemoglobin A1c  . Insulin, random  . Reticulocytes  . Iron, TIBC and Ferritin Panel  . EKG 12-Lead    Discussed med's effects and SE's. Screening labs and tests as requested with regular follow-up as recommended. Over 40 minutes of exam, counseling, chart review and critical decision making was performed  Future Appointments  Date Time Provider Department Center  05/06/2020  9:30 AM Quentin Mulling, PA-C [facility] None     HPI 39 y.o. Patient presents for a complete physical.  He has ADD (attention deficit disorder); Vitamin D deficiency; Anxiety state; Medication management; History of smoking 30 or more pack years; Mixed hyperlipidemia; Overweight (BMI 25.0-29.9); Aortic insufficiency; Seizure disorder (HCC); Paresthesia; Moderate depressive episode (HCC); Chronic low back pain with sciatica; Erectile dysfunction; and Marijuana smoker on their problem list.   He is going through divorce, got emergency custody of kids, still has lots of court dealings which have been frustrating. 2 kids, works as Administrator, arts.   Hx of seizures since childhood following head trauma, managed by tegretol and follows with neuro, last saw Dr. Terrace Arabia in 11/2018, pt reports has 1-2 min "staring" seizures, having more recently with increased stress, reports only at rest, typically at home in the evening.    He has hx of depressed mood/anxiety, some ? Bipolar, was on trial of vraylar due to severely  depressed mood while going through separation with wife and custody situation but didn't tolerate. hx of interolerance to numerous others. Has been on buspar 30 mg BID, adderall 30 mg daily on days he  works. He will be seeing psych, soonest appointment was next month.    he currently continues to smoke 2.5 pack a day; discussed risks associated with smoking, patient is not ready to quit. Reports also currently smoking marijuana daily, states helps with seizures and stress. He reports hx of asthma as child; reports occasionally recently has been having some wheezing. Denies cough, dyspnea.   BMI is Body mass index is 25.47 kg/m., he has not been working on diet and exercise. Wt Readings from Last 3 Encounters:  01/29/20 162 lb 9.6 oz (73.8 kg)  11/27/19 159 lb (72.1 kg)  10/15/19 161 lb (73 kg)   He had a cardiac evaluation by Dr Angelena Form related to episode of chest pain and left side numbess in December of 2019.  [place] work up Echo 55-60% with mod aortic valve regurgitation, cardiac enzymes and Neurology workup negative.  Dr. Julianne Handler ordered a CTA scheduled related to heavy tobacco use and FH of CAD, and recommended 6 month ECHO but hasn't followed up due to being too busy with custody situation.   His blood pressure has been controlled at home, today their BP is BP: 124/64. He does workout. He denies chest pain, shortness of breath, dizziness.   He is not on cholesterol medication and denies myalgias. His cholesterol is not at goal. The cholesterol last visit was:   Lab Results  Component Value Date   CHOL 201 (H) 10/26/2018   HDL 42 10/26/2018   LDLCALC 133 (H) 10/26/2018   TRIG 151 (H) 10/26/2018   CHOLHDL 4.8 10/26/2018   He  has been working on diet and exercise for glucose management, and denies hyperglycemia, hypoglycemia , nausea, polydipsia and polyuria. Last A1C in the office was:  Lab Results  Component Value Date   HGBA1C 5.1 10/26/2018   Last GFR: Lab Results  Component Value Date   GFRNONAA 98 10/15/2018   Patient is on Vitamin D supplement.   Lab Results  Component Value Date   VD25OH 29 (L) 10/26/2018       Current Medications:  Current  Outpatient Medications on File Prior to Visit  Medication Sig Dispense Refill  . busPIRone (BUSPAR) 30 MG tablet Take 1/2 to 1 tablet 2 x /day for Anxiety 180 tablet 0  . carbamazepine (TEGRETOL XR) 400 MG 12 hr tablet TAKE 1 TABLET(400 MG) BY MOUTH TWICE DAILY 60 tablet 0  . gabapentin (NEURONTIN) 300 MG capsule Take 1 to 2 capsules up to 3 x /day if needed for Nerve Pain 180 capsule 3   No current facility-administered medications on file prior to visit.   Allergies:  No Known Allergies Health Maintenance:  Immunization History  Administered Date(s) Administered  . Tdap 02/08/2015    Tdap: 01/2018 Pneumovax: N/A Prevnar 13: N/A Flu vaccine: Declines Zostavax: N/A  Eye Exam: Due - advised to schedule  Dentist: Dr. Marland Kitchen last April 2020, needs to schedule   Patient Care Team: Unk Pinto, MD as PCP - General (Internal Medicine) Burnell Blanks, MD as PCP - Cardiology (Cardiology) Garnet Sierras, NP as Nurse Practitioner (Adult Health Nurse Practitioner)  Medical History:  has ADD (attention deficit disorder); Vitamin D deficiency; Anxiety state; Medication management; History of smoking 30 or more pack years; Mixed hyperlipidemia; Overweight (BMI 25.0-29.9); Aortic insufficiency;  Seizure disorder (HCC); Paresthesia; Moderate depressive episode (HCC); Chronic low back pain with sciatica; Erectile dysfunction; and Marijuana smoker on their problem list. Surgical History:  He  has a past surgical history that includes Shoulder surgery (Right, 12-08-11); Tonsillectomy; Adenoidectomy; and Wisdom tooth extraction (10/19/2017). Family History:  His family history includes Dementia in his maternal grandmother; Esophageal cancer in his maternal grandfather; Heart attack in his maternal grandfather; Prostate cancer in his maternal grandfather; Stroke in his maternal grandfather. Social History:   reports that he has been smoking cigarettes. He started smoking about 28 years ago.  He has a 42.00 pack-year smoking history. He has never used smokeless tobacco. He reports current alcohol use. He reports current drug use. Drug: Marijuana. Review of Systems:  Review of Systems  Constitutional: Negative for chills, diaphoresis, fever and weight loss.  HENT: Negative for congestion, ear discharge, hearing loss, sore throat and tinnitus.   Eyes: Negative for blurred vision and double vision.  Respiratory: Positive for wheezing (occasional, intermittent). Negative for cough, hemoptysis, sputum production and shortness of breath.   Cardiovascular: Negative for chest pain, palpitations, orthopnea, claudication, leg swelling and PND.  Gastrointestinal: Negative for abdominal pain, blood in stool, constipation, diarrhea, heartburn, melena, nausea and vomiting.  Genitourinary: Negative.  Negative for flank pain.  Musculoskeletal: Positive for back pain. Negative for falls, joint pain, myalgias and neck pain.  Skin: Negative for rash.  Neurological: Positive for tingling (intermittent lower extremities). Negative for dizziness, tremors, sensory change, speech change, focal weakness, seizures, loss of consciousness, weakness and headaches.  Endo/Heme/Allergies: Negative for environmental allergies and polydipsia. Does not bruise/bleed easily.  Psychiatric/Behavioral: Positive for depression and substance abuse. Negative for hallucinations, memory loss and suicidal ideas. The patient is nervous/anxious and has insomnia.     Physical Exam: Estimated body mass index is 25.47 kg/m as calculated from the following:   Height as of this encounter: 5\' 7"  (1.702 m).   Weight as of this encounter: 162 lb 9.6 oz (73.8 kg). BP 124/64   Pulse 69   Temp 98.1 F (36.7 C)   Ht 5\' 7"  (1.702 m)   Wt 162 lb 9.6 oz (73.8 kg)   SpO2 97%   BMI 25.47 kg/m  General Appearance: Well nourished, in no apparent distress.  Eyes: PERRLA, EOMs, conjunctiva no swelling or erythema, normal fundi and vessels.   Sinuses: No Frontal/maxillary tenderness  ENT/Mouth: Ext aud canals clear, normal light reflex with TMs without erythema, bulging. Good dentition. No erythema, swelling, or exudate on post pharynx. Tonsils not swollen or erythematous. Hearing normal.  Neck: Supple, thyroid normal. No bruits  Respiratory: Respiratory effort normal, BS equal bilaterally decreased breath sounds at bases. No rales, wheezing or stridor.  Cardio: RRR .  Soft diastolic murmur.  No rubs or gallops. Brisk peripheral pulses without edema.  Chest: symmetric, with normal excursions and percussion.  Abdomen: Soft, nontender, no guarding, rebound, hernias, masses, or organomegaly.  Lymphatics: Non tender without lymphadenopathy.  Genitourinary:  Musculoskeletal: Full ROM all peripheral extremities,5/5 strength, and normal gait. Mild thoorasic scoliosis . Skin: Warm, dry without rashes, lesions, ecchymosis. Half dollar size area of alopecia at base of neck.  Neuro: Cranial nerves intact, reflexes equal bilaterally. Normal muscle tone, no cerebellar symptoms. Sensation intact.  Psych: Awake and oriented X 3, normal affect, Insight and Judgment appropriate.   EKG: Sinus bradycardia   Ita Fritzsche 1:01 PM [facility]

## 2020-01-29 ENCOUNTER — Other Ambulatory Visit: Payer: Self-pay

## 2020-01-29 ENCOUNTER — Ambulatory Visit (INDEPENDENT_AMBULATORY_CARE_PROVIDER_SITE_OTHER): Payer: BC Managed Care – PPO | Admitting: Adult Health

## 2020-01-29 ENCOUNTER — Encounter: Payer: Self-pay | Admitting: Adult Health

## 2020-01-29 VITALS — BP 124/64 | HR 69 | Temp 98.1°F | Ht 67.0 in | Wt 162.6 lb

## 2020-01-29 DIAGNOSIS — I351 Nonrheumatic aortic (valve) insufficiency: Secondary | ICD-10-CM | POA: Diagnosis not present

## 2020-01-29 DIAGNOSIS — R030 Elevated blood-pressure reading, without diagnosis of hypertension: Secondary | ICD-10-CM | POA: Diagnosis not present

## 2020-01-29 DIAGNOSIS — E559 Vitamin D deficiency, unspecified: Secondary | ICD-10-CM | POA: Diagnosis not present

## 2020-01-29 DIAGNOSIS — E663 Overweight: Secondary | ICD-10-CM

## 2020-01-29 DIAGNOSIS — Z8249 Family history of ischemic heart disease and other diseases of the circulatory system: Secondary | ICD-10-CM | POA: Diagnosis not present

## 2020-01-29 DIAGNOSIS — Z87891 Personal history of nicotine dependence: Secondary | ICD-10-CM

## 2020-01-29 DIAGNOSIS — F988 Other specified behavioral and emotional disorders with onset usually occurring in childhood and adolescence: Secondary | ICD-10-CM

## 2020-01-29 DIAGNOSIS — M5440 Lumbago with sciatica, unspecified side: Secondary | ICD-10-CM

## 2020-01-29 DIAGNOSIS — F32A Depression, unspecified: Secondary | ICD-10-CM

## 2020-01-29 DIAGNOSIS — Z79899 Other long term (current) drug therapy: Secondary | ICD-10-CM | POA: Diagnosis not present

## 2020-01-29 DIAGNOSIS — Z13220 Encounter for screening for lipoid disorders: Secondary | ICD-10-CM | POA: Diagnosis not present

## 2020-01-29 DIAGNOSIS — Z1389 Encounter for screening for other disorder: Secondary | ICD-10-CM | POA: Diagnosis not present

## 2020-01-29 DIAGNOSIS — Z136 Encounter for screening for cardiovascular disorders: Secondary | ICD-10-CM

## 2020-01-29 DIAGNOSIS — E781 Pure hyperglyceridemia: Secondary | ICD-10-CM

## 2020-01-29 DIAGNOSIS — Z131 Encounter for screening for diabetes mellitus: Secondary | ICD-10-CM

## 2020-01-29 DIAGNOSIS — Z130 Encounter for screening for diseases of the blood and blood-forming organs and certain disorders involving the immune mechanism: Secondary | ICD-10-CM | POA: Diagnosis not present

## 2020-01-29 DIAGNOSIS — N529 Male erectile dysfunction, unspecified: Secondary | ICD-10-CM

## 2020-01-29 DIAGNOSIS — Z0000 Encounter for general adult medical examination without abnormal findings: Secondary | ICD-10-CM | POA: Diagnosis not present

## 2020-01-29 DIAGNOSIS — Z1329 Encounter for screening for other suspected endocrine disorder: Secondary | ICD-10-CM | POA: Diagnosis not present

## 2020-01-29 DIAGNOSIS — G40909 Epilepsy, unspecified, not intractable, without status epilepticus: Secondary | ICD-10-CM

## 2020-01-29 DIAGNOSIS — M5416 Radiculopathy, lumbar region: Secondary | ICD-10-CM | POA: Insufficient documentation

## 2020-01-29 DIAGNOSIS — F321 Major depressive disorder, single episode, moderate: Secondary | ICD-10-CM

## 2020-01-29 DIAGNOSIS — F1290 Cannabis use, unspecified, uncomplicated: Secondary | ICD-10-CM

## 2020-01-29 DIAGNOSIS — F411 Generalized anxiety disorder: Secondary | ICD-10-CM

## 2020-01-29 DIAGNOSIS — G8929 Other chronic pain: Secondary | ICD-10-CM

## 2020-01-29 DIAGNOSIS — Z0001 Encounter for general adult medical examination with abnormal findings: Secondary | ICD-10-CM

## 2020-01-29 DIAGNOSIS — D649 Anemia, unspecified: Secondary | ICD-10-CM

## 2020-01-29 DIAGNOSIS — E782 Mixed hyperlipidemia: Secondary | ICD-10-CM

## 2020-01-29 MED ORDER — ALBUTEROL SULFATE HFA 108 (90 BASE) MCG/ACT IN AERS: 1.0000 | INHALATION_SPRAY | RESPIRATORY_TRACT | 1 refills | Status: DC | PRN

## 2020-01-29 MED ORDER — VITAMIN D (ERGOCALCIFEROL) 1.25 MG (50000 UNIT) PO CAPS: ORAL_CAPSULE | ORAL | 1 refills | Status: DC

## 2020-01-29 MED ORDER — AMPHETAMINE-DEXTROAMPHETAMINE 30 MG PO TABS: ORAL_TABLET | ORAL | 0 refills | Status: DC

## 2020-01-29 MED ORDER — TADALAFIL 10 MG PO TABS: 10.0000 mg | ORAL_TABLET | ORAL | 0 refills | Status: DC | PRN

## 2020-01-29 NOTE — Patient Instructions (Addendum)
  [name] , Thank you for taking time to come for your Medicare Wellness Visit. I appreciate your ongoing commitment to your health goals. Please review the following plan we discussed and let me know if I can assist you in the future.   These are the goals we discussed: Goals    . Quit Smoking       This is a list of the screening recommended for you and due dates:  Health Maintenance  Topic Date Due  .  Hepatitis C: One time screening is recommended by Center for Disease Control  (CDC) for  adults born from [birth year] through 1964.   Never done  . COVID-19 Vaccine (1) Never done  . HIV Screening  Never done  . Flu Shot  03/22/2020  . Tetanus Vaccine  02/07/2025    Please schedule a vision appointment   Please schedule dental appointment     Know what a healthy weight is for you (roughly BMI <25) and aim to maintain this  Aim for 7+ servings of fruits and vegetables daily  65-80+ fluid ounces of water or unsweet tea for healthy kidneys  Limit to max 1 drink of alcohol per day; avoid smoking/tobacco  Limit animal fats in diet for cholesterol and heart health - choose grass fed whenever available  Avoid highly processed foods, and foods high in saturated/trans fats  Aim for low stress - take time to unwind and care for your mental health  Aim for 150 min of moderate intensity exercise weekly for heart health, and weights twice weekly for bone health  Aim for 7-9 hours of sleep daily     SMOKING CESSATION  American cancer society  15400867619 for more information or for a free program for smoking cessation help.   You can call QUIT SMART 1-800-QUIT-NOW for free nicotine patches or replacement therapy- if they are out- keep calling  [facility] Can call for smoking cessation classes, [phone]  If you have a smart phone, please look up Smoke Free app, this will help you stay on track and give you information about money you have saved, life that you  have gained back and a ton of more information.     ADVANTAGES OF QUITTING SMOKING  Within 20 minutes, blood pressure decreases. Your pulse is at normal level.  After 8 hours, carbon monoxide levels in the blood return to normal. Your oxygen level increases.  After 24 hours, the chance of having a heart attack starts to decrease. Your breath, hair, and body stop smelling like smoke.  After 48 hours, damaged nerve endings begin to recover. Your sense of taste and smell improve.  After 72 hours, the body is virtually free of nicotine. Your bronchial tubes relax and breathing becomes easier.  After 2 to 12 weeks, lungs can hold more air. Exercise becomes easier and circulation improves.  After 1 year, the risk of coronary heart disease is cut in half.  After 5 years, the risk of stroke falls to the same as a nonsmoker.  After 10 years, the risk of lung cancer is cut in half and the risk of other cancers decreases significantly.  After 15 years, the risk of coronary heart disease drops, usually to the level of a nonsmoker.  You will have extra money to spend on things other than cigarettes.

## 2020-01-30 ENCOUNTER — Other Ambulatory Visit: Payer: Self-pay | Admitting: Adult Health

## 2020-01-30 DIAGNOSIS — E611 Iron deficiency: Secondary | ICD-10-CM | POA: Insufficient documentation

## 2020-01-30 DIAGNOSIS — E559 Vitamin D deficiency, unspecified: Secondary | ICD-10-CM

## 2020-01-30 LAB — URINALYSIS, ROUTINE W REFLEX MICROSCOPIC
Bilirubin Urine: NEGATIVE
Glucose, UA: NEGATIVE
Hgb urine dipstick: NEGATIVE
Ketones, ur: NEGATIVE
Leukocytes,Ua: NEGATIVE
Nitrite: NEGATIVE
Protein, ur: NEGATIVE
Specific Gravity, Urine: 1.009 (ref 1.001–1.03)
pH: 7 (ref 5.0–8.0)

## 2020-01-30 LAB — LIPID PANEL
Cholesterol: 141 mg/dL
HDL: 45 mg/dL
LDL Cholesterol (Calc): 78 mg/dL
Non-HDL Cholesterol (Calc): 96 mg/dL
Total CHOL/HDL Ratio: 3.1 (calc)
Triglycerides: 96 mg/dL

## 2020-01-30 LAB — INSULIN, RANDOM: Insulin: 17.2 u[IU]/mL

## 2020-01-30 LAB — COMPLETE METABOLIC PANEL WITH GFR
AG Ratio: 2.1 (calc) (ref 1.0–2.5)
ALT: 11 U/L (ref 9–46)
AST: 13 U/L (ref 10–40)
Albumin: 4 g/dL (ref 3.6–5.1)
Alkaline phosphatase (APISO): 66 U/L (ref 36–130)
BUN: 9 mg/dL (ref 7–25)
CO2: 28 mmol/L (ref 20–32)
Calcium: 9.2 mg/dL (ref 8.6–10.3)
Chloride: 107 mmol/L (ref 98–110)
Creat: 0.87 mg/dL (ref 0.60–1.35)
GFR, Est African American: 128 mL/min/{1.73_m2} (ref >=60)
GFR, Est Non African American: 110 mL/min/{1.73_m2} (ref >=60)
Globulin: 1.9 g/dL (calc) (ref 1.9–3.7)
Glucose, Bld: 94 mg/dL (ref 65–99)
Potassium: 4.3 mmol/L (ref 3.5–5.3)
Sodium: 141 mmol/L (ref 135–146)
Total Bilirubin: 0.3 mg/dL (ref 0.2–1.2)
Total Protein: 5.9 g/dL — ABNORMAL LOW (ref 6.1–8.1)

## 2020-01-30 LAB — CBC WITH DIFFERENTIAL/PLATELET
Absolute Monocytes: 641 cells/uL (ref 200–950)
Basophils Absolute: 98 cells/uL (ref 0–200)
Basophils Relative: 1.1 %
Eosinophils Absolute: 89 cells/uL (ref 15–500)
Eosinophils Relative: 1 %
HCT: 44.5 % (ref 38.5–50.0)
Hemoglobin: 15.1 g/dL (ref 13.2–17.1)
Lymphs Abs: 1825 cells/uL (ref 850–3900)
MCH: 29.4 pg (ref 27.0–33.0)
MCHC: 33.9 g/dL (ref 32.0–36.0)
MCV: 86.7 fL (ref 80.0–100.0)
MPV: 9.9 fL (ref 7.5–12.5)
Monocytes Relative: 7.2 %
Neutro Abs: 6248 cells/uL (ref 1500–7800)
Neutrophils Relative %: 70.2 %
Platelets: 244 10*3/uL (ref 140–400)
RBC: 5.13 10*6/uL (ref 4.20–5.80)
RDW: 11.9 % (ref 11.0–15.0)
Total Lymphocyte: 20.5 %
WBC: 8.9 10*3/uL (ref 3.8–10.8)

## 2020-01-30 LAB — IRON,TIBC AND FERRITIN PANEL
%SAT: 13 % (calc) — ABNORMAL LOW (ref 20–48)
Ferritin: 23 ng/mL — ABNORMAL LOW (ref 38–380)
Iron: 46 ug/dL — ABNORMAL LOW (ref 50–180)
TIBC: 350 mcg/dL (calc) (ref 250–425)

## 2020-01-30 LAB — RETICULOCYTES
ABS Retic: 47700 cells/uL (ref 25000–9000)
Retic Ct Pct: 0.9 %

## 2020-01-30 LAB — HEMOGLOBIN A1C
Hgb A1c MFr Bld: 4.9 %{Hb}
Mean Plasma Glucose: 94 (calc)
eAG (mmol/L): 5.2 (calc)

## 2020-01-30 LAB — TSH: TSH: 0.4 m[IU]/L (ref 0.40–4.50)

## 2020-01-30 LAB — VITAMIN D 25 HYDROXY (VIT D DEFICIENCY, FRACTURES): Vit D, 25-Hydroxy: 129 ng/mL — ABNORMAL HIGH (ref 30–100)

## 2020-01-30 LAB — CARBAMAZEPINE LEVEL, TOTAL: Carbamazepine Lvl: <2 mg/L — ABNORMAL LOW (ref 4.0–12.0)

## 2020-01-30 LAB — MAGNESIUM: Magnesium: 2.2 mg/dL (ref 1.5–2.5)

## 2020-01-30 MED ORDER — VITAMIN D (ERGOCALCIFEROL) 1.25 MG (50000 UNIT) PO CAPS: ORAL_CAPSULE | ORAL | 1 refills | Status: DC

## 2020-02-26 DIAGNOSIS — S93601A Unspecified sprain of right foot, initial encounter: Secondary | ICD-10-CM | POA: Diagnosis not present

## 2020-02-26 DIAGNOSIS — S20221A Contusion of right back wall of thorax, initial encounter: Secondary | ICD-10-CM | POA: Diagnosis not present

## 2020-02-26 DIAGNOSIS — S93401A Sprain of unspecified ligament of right ankle, initial encounter: Secondary | ICD-10-CM | POA: Diagnosis not present

## 2020-02-28 DIAGNOSIS — M25571 Pain in right ankle and joints of right foot: Secondary | ICD-10-CM | POA: Diagnosis not present

## 2020-03-03 ENCOUNTER — Other Ambulatory Visit: Payer: Self-pay

## 2020-03-03 DIAGNOSIS — F988 Other specified behavioral and emotional disorders with onset usually occurring in childhood and adolescence: Secondary | ICD-10-CM

## 2020-03-03 MED ORDER — AMPHETAMINE-DEXTROAMPHETAMINE 30 MG PO TABS: ORAL_TABLET | ORAL | 0 refills | Status: DC

## 2020-03-03 NOTE — Telephone Encounter (Signed)
Adderall refill request. In que, for your review.

## 2020-04-02 ENCOUNTER — Other Ambulatory Visit: Payer: Self-pay

## 2020-04-02 DIAGNOSIS — F988 Other specified behavioral and emotional disorders with onset usually occurring in childhood and adolescence: Secondary | ICD-10-CM

## 2020-04-02 NOTE — Telephone Encounter (Signed)
Refill request for Adderall. In que for your review. 

## 2020-04-04 MED ORDER — AMPHETAMINE-DEXTROAMPHETAMINE 30 MG PO TABS: ORAL_TABLET | ORAL | 0 refills | Status: DC

## 2020-04-17 ENCOUNTER — Other Ambulatory Visit: Payer: Self-pay | Admitting: Adult Health

## 2020-04-20 DIAGNOSIS — L02411 Cutaneous abscess of right axilla: Secondary | ICD-10-CM | POA: Diagnosis not present

## 2020-04-22 DIAGNOSIS — L02411 Cutaneous abscess of right axilla: Secondary | ICD-10-CM | POA: Diagnosis not present

## 2020-04-29 ENCOUNTER — Telehealth: Payer: Self-pay | Admitting: Physical Medicine and Rehabilitation

## 2020-04-29 NOTE — Telephone Encounter (Signed)
Patient called. He would like an appointment with Dr. Alvester Morin. His call back number is [phone]

## 2020-04-30 ENCOUNTER — Telehealth: Payer: Self-pay | Admitting: Physical Medicine and Rehabilitation

## 2020-04-30 NOTE — Telephone Encounter (Signed)
Left message #1

## 2020-04-30 NOTE — Telephone Encounter (Signed)
Hasn't been quite a year so if he really feels like same thing and it helped then ok to repeat. If insurance weird then ov

## 2020-04-30 NOTE — Telephone Encounter (Signed)
Patient called asking to be see for back injection. Patient is asking for a call back. Patient phone number is [phone].

## 2020-04-30 NOTE — Telephone Encounter (Signed)
Right L2 TF 08/01/2019. Ok to repeat if helped, same problem/side, and no new injury vs OV since over a year?

## 2020-05-04 NOTE — Progress Notes (Deleted)
OFFICE VISIT  Assessment and Plan:  Attention deficit disorder (ADD) without hyperactivity Doing well on current regimen, continue with benefit Refill sent electronically.  Mixed hyperlipidemia Ideal LDL <70 due to smoking and risk factors; plan to start rosuvastatin pending labs -  Discussed dietary and exercise modificaitons -     Lipid panel  History of smoking 30 or more pack years Discussed this at length, risks Not ready to quit, declines meds Will continue to assess readiness  Anxiety state Fairly controlled with current meds at this time, newly established with psch Stress management techniques discussed, increase water, good sleep hygiene discussed, increase exercise, and increase veggies.   Seizure disorder [facility]) Check tegretol levels Advised needs eye exams regularly with this medication Advised schedule follow up with Dr. Terrace Arabia  Vitamin D deficiency Continue supplementation -     VITAMIN D 25 Hydroxy (Vit-D Deficiency)   Discussed med's effects and SE's. Screening labs and tests as requested with regular follow-up as recommended. Over 30 minutes of exam, counseling, chart review and critical decision making was performed  Future Appointments  Date Time Provider Department Center  05/06/2020  9:30 AM Quentin Mulling, PA-C [facility] None     HPI 39 y.o. Patient presents for follow up for conditions, he has ADD (attention deficit disorder); Vitamin D deficiency; Anxiety state; Medication management; History of smoking 30 or more pack years; Mixed hyperlipidemia; Overweight (BMI 25.0-29.9); Aortic insufficiency; Seizure disorder (HCC); Paresthesia; Moderate depressive episode (HCC); Chronic low back pain with sciatica; Erectile dysfunction; Marijuana smoker; and Iron deficiency on their problem list.   He is going through divorce, got emergency custody of kids, still has lots of court dealings which have been frustrating. 2 kids, works as Administrator, arts.   Hx of  seizures since childhood following head trauma, managed by tegretol and follows with neuro, last saw Dr. Terrace Arabia in 11/2018, pt reports has 1-2 min "staring" seizures, having more recently with increased stress, reports only at rest, typically at home in the evening.    He has hx of depressed mood/anxiety, some ? Bipolar, was on trial of vraylar due to severely depressed mood while going through separation with wife and custody situation but didn't tolerate. hx of interolerance to numerous others. Has been on buspar 30 mg BID, adderall 30 mg daily on days he works. He will be seeing psych, soonest appointment was next month.    he currently continues to smoke 2.5 pack a day; discussed risks associated with smoking, patient is not ready to quit. Reports also currently smoking marijuana daily, states helps with seizures and stress. He reports hx of asthma as child; reports occasionally recently has been having some wheezing. Denies cough, dyspnea.   BMI is There is no height or weight on file to calculate BMI., he has not been working on diet and exercise. Wt Readings from Last 3 Encounters:  01/29/20 162 lb 9.6 oz (73.8 kg)  11/27/19 159 lb (72.1 kg)  10/15/19 161 lb (73 kg)   His blood pressure has been controlled at home, today their BP is  . He does workout. He denies chest pain, shortness of breath, dizziness.   He is not on cholesterol medication and denies myalgias. His cholesterol is not at goal. The cholesterol last visit was:   Lab Results  Component Value Date   CHOL 141 01/29/2020   HDL 45 01/29/2020   LDLCALC 78 01/29/2020   TRIG 96 01/29/2020   CHOLHDL 3.1 01/29/2020   He  has been  working on diet and exercise for glucose management, and denies hyperglycemia, hypoglycemia , nausea, polydipsia and polyuria. Last A1C in the office was:  Lab Results  Component Value Date   HGBA1C 4.9 01/29/2020   Last GFR: Lab Results  Component Value Date   GFRNONAA 110 01/29/2020   Patient is  on Vitamin D supplement.   Lab Results  Component Value Date   VD25OH 129 (H) 01/29/2020       Current Medications:     Current Outpatient Medications (Cardiovascular):  .  tadalafil (CIALIS) 10 MG tablet, TAKE 1 TABLET BY MOUTH AS NEEDED FOR ERECTILE DYSFUNCTION  Current Outpatient Medications (Respiratory):  .  albuterol (VENTOLIN HFA) 108 (90 Base) MCG/ACT inhaler, Inhale 1-2 puffs into the lungs every 4 (four) hours as needed for wheezing or shortness of breath.    Current Outpatient Medications (Other):  .  amphetamine-dextroamphetamine (ADDERALL) 30 MG tablet, Take 1/2 to 1or 2 tablets /day & please try to limit to 5 days /week (10 tab/week) to avoid addiction. .  busPIRone (BUSPAR) 30 MG tablet, Take 1/2 to 1 tablet 2 x /day for Anxiety .  carbamazepine (TEGRETOL XR) 400 MG 12 hr tablet, TAKE 1 TABLET(400 MG) BY MOUTH TWICE DAILY .  gabapentin (NEURONTIN) 300 MG capsule, Take 1 to 2 capsules up to 3 x /day if needed for Nerve Pain .  Vitamin D, Ergocalciferol, (DRISDOL) 1.25 MG (50000 UNIT) CAPS capsule, Take one capsule 3 days a week for Vitamin D deficiency  Allergies:  No Known Allergies  Medical History:  has ADD (attention deficit disorder); Vitamin D deficiency; Anxiety state; Medication management; History of smoking 30 or more pack years; Mixed hyperlipidemia; Overweight (BMI 25.0-29.9); Aortic insufficiency; Seizure disorder (HCC); Paresthesia; Moderate depressive episode (HCC); Chronic low back pain with sciatica; Erectile dysfunction; Marijuana smoker; and Iron deficiency on their problem list. Surgical History:  He  has a past surgical history that includes Shoulder surgery (Right, 12-08-11); Tonsillectomy; Adenoidectomy; and Wisdom tooth extraction (10/19/2017). Family History:  His family history includes Dementia in his maternal grandmother; Esophageal cancer in his maternal grandfather; Heart attack in his maternal grandfather; Prostate cancer in his maternal  grandfather; Stroke in his maternal grandfather. Social History:   reports that he has been smoking cigarettes. He started smoking about 28 years ago. He has a 42.00 pack-year smoking history. He has never used smokeless tobacco. He reports current alcohol use. He reports current drug use. Drug: Marijuana. Review of Systems:  Review of Systems  Constitutional: Negative for chills, diaphoresis, fever and weight loss.  HENT: Negative for congestion, ear discharge, hearing loss, sore throat and tinnitus.   Eyes: Negative for blurred vision and double vision.  Respiratory: Positive for wheezing (occasional, intermittent). Negative for cough, hemoptysis, sputum production and shortness of breath.   Cardiovascular: Negative for chest pain, palpitations, orthopnea, claudication, leg swelling and PND.  Gastrointestinal: Negative for abdominal pain, blood in stool, constipation, diarrhea, heartburn, melena, nausea and vomiting.  Genitourinary: Negative.  Negative for flank pain.  Musculoskeletal: Positive for back pain. Negative for falls, joint pain, myalgias and neck pain.  Skin: Negative for rash.  Neurological: Positive for tingling (intermittent lower extremities). Negative for dizziness, tremors, sensory change, speech change, focal weakness, seizures, loss of consciousness, weakness and headaches.  Endo/Heme/Allergies: Negative for environmental allergies and polydipsia. Does not bruise/bleed easily.  Psychiatric/Behavioral: Positive for depression and substance abuse. Negative for hallucinations, memory loss and suicidal ideas. The patient is nervous/anxious and has insomnia.  Physical Exam: Estimated body mass index is 25.47 kg/m as calculated from the following:   Height as of 01/29/20: 5\' 7"  (1.702 m).   Weight as of 01/29/20: 162 lb 9.6 oz (73.8 kg). There were no vitals taken for this visit. General Appearance: Well nourished, in no apparent distress.  Eyes: PERRLA, EOMs, conjunctiva no  swelling or erythema, normal fundi and vessels.  Sinuses: No Frontal/maxillary tenderness  ENT/Mouth: Ext aud canals clear, normal light reflex with TMs without erythema, bulging. Good dentition. No erythema, swelling, or exudate on post pharynx. Tonsils not swollen or erythematous. Hearing normal.  Neck: Supple, thyroid normal. No bruits  Respiratory: Respiratory effort normal, BS equal bilaterally decreased breath sounds at bases. No rales, wheezing or stridor.  Cardio: RRR .  Soft diastolic murmur.  No rubs or gallops. Brisk peripheral pulses without edema.  Chest: symmetric, with normal excursions and percussion.  Abdomen: Soft, nontender, no guarding, rebound, hernias, masses, or organomegaly.  Lymphatics: Non tender without lymphadenopathy.  Musculoskeletal: Full ROM all peripheral extremities,5/5 strength, and normal gait. Mild thoorasic scoliosis . Skin: Warm, dry without rashes, lesions, ecchymosis. Half dollar size area of alopecia at base of neck.  Neuro: Cranial nerves intact, reflexes equal bilaterally. Normal muscle tone, no cerebellar symptoms. Sensation intact.  Psych: Awake and oriented X 3, normal affect, Insight and Judgment appropriate.     03/30/20 1:29 PM [facility]

## 2020-05-06 ENCOUNTER — Other Ambulatory Visit: Payer: Self-pay | Admitting: Internal Medicine

## 2020-05-06 ENCOUNTER — Ambulatory Visit: Payer: BC Managed Care – PPO | Admitting: Physician Assistant

## 2020-05-06 DIAGNOSIS — F988 Other specified behavioral and emotional disorders with onset usually occurring in childhood and adolescence: Secondary | ICD-10-CM

## 2020-05-06 MED ORDER — AMPHETAMINE-DEXTROAMPHETAMINE 30 MG PO TABS: ORAL_TABLET | ORAL | 0 refills | Status: DC

## 2020-05-08 NOTE — Telephone Encounter (Signed)
Left message #2. Call went straight to voicemail.

## 2020-05-11 NOTE — Progress Notes (Signed)
OFFICE VISIT  Assessment and Plan:  Attention deficit disorder (ADD) without hyperactivity Doing well on current regimen, continue with benefit  Mixed hyperlipidemia Ideal LDL <70 due to smoking and risk factors Declines labs today, get in 3 months  History of smoking 30 or more pack years Discussed this at length, risks Not ready to quit, declines meds Will continue to assess readiness  Anxiety state Not controlled at this time, going to become newly established with psch Stress management techniques discussed, increase water, good sleep hygiene discussed, increase exercise, and increase veggies.   Seizure disorder (HCC) Check tegretol levels nect OV Advised needs eye exams regularly with this medication Advised schedule follow up with Dr. Terrace Arabia  Iron def Get on iron pill Check next OV  Chronic low back pain with sciatica, sciatica laterality unspecified, unspecified back pain laterality -     celecoxib (CELEBREX) 200 MG capsule; Take 1 capsule (200 mg total) by mouth 2 (two) times daily. Increase gabapentin   Discussed med's effects and SE's. Screening labs and tests as requested with regular follow-up as recommended. Over 30 minutes of exam, counseling, chart review and critical decision making was performed  Future Appointments  Date Time Provider Department Center  05/12/2020  3:30 PM Quentin Mulling, PA-C [id number] None     HPI 39 y.o. Patient presents for follow up for conditions, he has ADD (attention deficit disorder); Vitamin D deficiency; Anxiety state; Medication management; History of smoking 30 or more pack years; Mixed hyperlipidemia; Overweight (BMI 25.0-29.9); Aortic insufficiency; Seizure disorder (HCC); Paresthesia; Moderate depressive episode (HCC); Chronic low back pain with sciatica; Erectile dysfunction; Marijuana smoker; and Iron deficiency on their problem list.   He is going through divorce, got custody of kids his 2 kids, still going to court for  attempted murder and gun charge. He is still having back pain, sciatic nerve pain, taking a lot of tylenol and aleve, 2 kids, works as Administrator, arts.  He reports has been on zoloft, celexa in the past, had SE, felt bad and fatigued. Has also been on amitriptylene, wellbutrin, seroquel and trazodone in the past.  Hx of seizures since childhood following head trauma, managed by tegretol and follows with neuro, last saw Dr. Terrace Arabia in 11/2018, pt reports has 1-2 min "staring" seizures, having more recently with increased stress, reports only at rest, typically at home in the evening.     Has been on buspar 30 mg BID, adderall 30 mg daily on days he works. He will be seeing psych, soonest appointment was next month.    he currently continues to smoke 2.5 pack a day; discussed risks associated with smoking, patient is not ready to quit. Reports also currently smoking marijuana daily, states helps with seizures and stress. He reports hx of asthma as child; reports occasionally recently has been having some wheezing. Denies cough, dyspnea.   BMI is There is no height or weight on file to calculate BMI., he has not been working on diet and exercise. Wt Readings from Last 3 Encounters:  01/29/20 162 lb 9.6 oz (73.8 kg)  11/27/19 159 lb (72.1 kg)  10/15/19 161 lb (73 kg)   His blood pressure has been controlled at home, today their BP is  . He does workout. He denies chest pain, shortness of breath, dizziness.   He is not on cholesterol medication and denies myalgias. His cholesterol is not at goal. The cholesterol last visit was:   Lab Results  Component Value Date   CHOL 141  01/29/2020   HDL 45 01/29/2020   LDLCALC 78 01/29/2020   TRIG 96 01/29/2020   CHOLHDL 3.1 01/29/2020   He  has been working on diet and exercise for glucose management, and denies hyperglycemia, hypoglycemia , nausea, polydipsia and polyuria. Last A1C in the office was:  Lab Results  Component Value Date   HGBA1C 4.9 01/29/2020    Last GFR: Lab Results  Component Value Date   GFRNONAA 110 01/29/2020   Patient is on Vitamin D supplement.   Lab Results  Component Value Date   VD25OH 129 (H) 01/29/2020       Current Medications:     Current Outpatient Medications (Cardiovascular):  .  tadalafil (CIALIS) 10 MG tablet, TAKE 1 TABLET BY MOUTH AS NEEDED FOR ERECTILE DYSFUNCTION  Current Outpatient Medications (Respiratory):  .  albuterol (VENTOLIN HFA) 108 (90 Base) MCG/ACT inhaler, Inhale 1-2 puffs into the lungs every 4 (four) hours as needed for wheezing or shortness of breath.    Current Outpatient Medications (Other):  .  amphetamine-dextroamphetamine (ADDERALL) 30 MG tablet, Take 1/2 to 1or 2 tablets /day & please try to limit to 5 days /week (10 tab/week) to avoid addiction. (next refill due Oct 28) .  busPIRone (BUSPAR) 30 MG tablet, Take 1/2 to 1 tablet 2 x /day for Anxiety .  carbamazepine (TEGRETOL XR) 400 MG 12 hr tablet, TAKE 1 TABLET(400 MG) BY MOUTH TWICE DAILY .  gabapentin (NEURONTIN) 300 MG capsule, Take 1 to 2 capsules up to 3 x /day if needed for Nerve Pain .  Vitamin D, Ergocalciferol, (DRISDOL) 1.25 MG (50000 UNIT) CAPS capsule, Take one capsule 3 days a week for Vitamin D deficiency  Allergies:  No Known Allergies  Medical History:  has ADD (attention deficit disorder); Vitamin D deficiency; Anxiety state; Medication management; History of smoking 30 or more pack years; Mixed hyperlipidemia; Overweight (BMI 25.0-29.9); Aortic insufficiency; Seizure disorder (HCC); Paresthesia; Moderate depressive episode (HCC); Chronic low back pain with sciatica; Erectile dysfunction; Marijuana smoker; and Iron deficiency on their problem list. Surgical History:  He  has a past surgical history that includes Shoulder surgery (Right, 12-08-11); Tonsillectomy; Adenoidectomy; and Wisdom tooth extraction (10/19/2017). Family History:  His family history includes Dementia in his maternal grandmother;  Esophageal cancer in his maternal grandfather; Heart attack in his maternal grandfather; Prostate cancer in his maternal grandfather; Stroke in his maternal grandfather. Social History:   reports that he has been smoking cigarettes. He started smoking about 28 years ago. He has a 42.00 pack-year smoking history. He has never used smokeless tobacco. He reports current alcohol use. He reports current drug use. Drug: Marijuana. Review of Systems:  Review of Systems  Constitutional: Negative for chills, diaphoresis, fever and weight loss.  HENT: Negative for congestion, ear discharge, hearing loss, sore throat and tinnitus.   Eyes: Negative for blurred vision and double vision.  Respiratory: Positive for wheezing (occasional, intermittent). Negative for cough, hemoptysis, sputum production and shortness of breath.   Cardiovascular: Negative for chest pain, palpitations, orthopnea, claudication, leg swelling and PND.  Gastrointestinal: Negative for abdominal pain, blood in stool, constipation, diarrhea, heartburn, melena, nausea and vomiting.  Genitourinary: Negative.  Negative for flank pain.  Musculoskeletal: Positive for back pain. Negative for falls, joint pain, myalgias and neck pain.  Skin: Negative for rash.  Neurological: Positive for tingling (intermittent lower extremities). Negative for dizziness, tremors, sensory change, speech change, focal weakness, seizures, loss of consciousness, weakness and headaches.  Endo/Heme/Allergies: Negative for environmental allergies and  polydipsia. Does not bruise/bleed easily.  Psychiatric/Behavioral: Positive for depression and substance abuse. Negative for hallucinations, memory loss and suicidal ideas. The patient is nervous/anxious and has insomnia.     Physical Exam: Estimated body mass index is 25.47 kg/m as calculated from the following:   Height as of 01/29/20: 5\' 7"  (1.702 m).   Weight as of 01/29/20: 162 lb 9.6 oz (73.8 kg). There were no vitals  taken for this visit. General Appearance: Well nourished, in no apparent distress.  Eyes: PERRLA, EOMs, conjunctiva no swelling or erythema, normal fundi and vessels.  Sinuses: No Frontal/maxillary tenderness  ENT/Mouth: Ext aud canals clear, normal light reflex with TMs without erythema, bulging. Good dentition. No erythema, swelling, or exudate on post pharynx. Tonsils not swollen or erythematous. Hearing normal.  Neck: Supple, thyroid normal. No bruits  Respiratory: Respiratory effort normal, BS equal bilaterally decreased breath sounds at bases. No rales, wheezing or stridor.  Cardio: RRR .  Soft diastolic murmur.  No rubs or gallops. Brisk peripheral pulses without edema.  Chest: symmetric, with normal excursions and percussion.  Abdomen: Soft, nontender, no guarding, rebound, hernias, masses, or organomegaly.  Lymphatics: Non tender without lymphadenopathy.  Musculoskeletal: Full ROM all peripheral extremities,5/5 strength, and normal gait. Mild thoorasic scoliosis . Skin: Warm, dry without rashes, lesions, ecchymosis. Half dollar size area of alopecia at base of neck.  Neuro: Cranial nerves intact, reflexes equal bilaterally. Normal muscle tone, no cerebellar symptoms. Sensation intact.  Psych: Awake and oriented X 3, normal affect, Insight and Judgment appropriate.     03/30/20 1:43 PM [facility]

## 2020-05-12 ENCOUNTER — Other Ambulatory Visit: Payer: Self-pay

## 2020-05-12 ENCOUNTER — Ambulatory Visit (INDEPENDENT_AMBULATORY_CARE_PROVIDER_SITE_OTHER): Payer: BC Managed Care – PPO | Admitting: Physician Assistant

## 2020-05-12 ENCOUNTER — Encounter: Payer: Self-pay | Admitting: Physician Assistant

## 2020-05-12 VITALS — BP 120/80 | HR 75 | Temp 97.7°F | Wt 183.0 lb

## 2020-05-12 DIAGNOSIS — F32A Depression, unspecified: Secondary | ICD-10-CM

## 2020-05-12 DIAGNOSIS — Z87891 Personal history of nicotine dependence: Secondary | ICD-10-CM

## 2020-05-12 DIAGNOSIS — F321 Major depressive disorder, single episode, moderate: Secondary | ICD-10-CM

## 2020-05-12 DIAGNOSIS — E611 Iron deficiency: Secondary | ICD-10-CM

## 2020-05-12 DIAGNOSIS — G40909 Epilepsy, unspecified, not intractable, without status epilepticus: Secondary | ICD-10-CM

## 2020-05-12 DIAGNOSIS — G8929 Other chronic pain: Secondary | ICD-10-CM

## 2020-05-12 DIAGNOSIS — M5440 Lumbago with sciatica, unspecified side: Secondary | ICD-10-CM

## 2020-05-12 MED ORDER — TADALAFIL 10 MG PO TABS: ORAL_TABLET | ORAL | 1 refills | Status: DC

## 2020-05-12 MED ORDER — CELECOXIB 200 MG PO CAPS: 200.0000 mg | ORAL_CAPSULE | Freq: Two times a day (BID) | ORAL | 2 refills | Status: DC

## 2020-05-12 NOTE — Patient Instructions (Addendum)
Take the celebrex 200mg  1 pill twice a day- do not take ibuprofen or aleve with it Can take tylenol with it, only take up to 3000-4000 mg a day  Neurontin can take 3 pill up to 3 x a day is the max  Follow up with psych/counseling  Will do sample inhaler  INFORMATION ABOUT YOUR STEROID INHALER  Can do steroid inhaler, NEED TO DO DAILY, this is NOT a rescue inhaler so if you are acutely short of breath please use your albuterol or call 911.  Do 1 puff TWICE a day.  Do before you brush your teeth OR wash your mouth afterwards.  IF YOU DO NOT WASH YOUR MOUTH OUT IT CAN CAUSE YEAST Can do 2 tsp vinegar with water and switch to help prevent yeast or help yeast in your mouth.   Go to the ER if any chest pain, shortness of breath, nausea, dizziness, severe HA, changes vision/speech    Go to the ER if any chest pain, shortness of breath, nausea, dizziness, severe HA, changes vision/speech  Here is some information to help you keep your heart healthy: Move it! - Aim for 30 mins of activity every day. Take it slowly at first. Talk to before starting any new exercise program.   Lose it.  -Body Mass Index (BMI) can indicate if you need to lose weight. A healthy range is 18.5-24.9. For a BMI calculator, go to 09-15-1992.com  Waist Management -Excess abdominal fat is a risk factor for heart disease, diabetes, asthma, stroke and more. Ideal waist circumference is less than 35" for women and less than 40" for men.   Eat Right -focus on fruits, vegetables, whole grains, and meals you make yourself. Avoid foods with trans fat and high sugar/sodium content.   Snooze or Snore? - Loud snoring can be a sign of sleep apnea, a significant risk factor for high blood pressure, heart attach, stroke, and heart arrhythmias.  Kick the habit -Quit Smoking! Avoid second hand smoke. A single cigarette raises your blood pressure for 20 mins and increases the risk of heart attack and stroke for the next 24  hours.   Are Aspirin and Supplements right for you? -Add ENTERIC COATED low dose 81 mg Aspirin daily OR can do every other day if you have easy bruising to protect your heart and head. As well as to reduce risk of Colon Cancer by 20 %, Skin Cancer by 26 % , Melanoma by 46% and Pancreatic cancer by 60%  Say "No to Stress -There may be little you can do about problems that cause stress. However, techniques such as long walks, meditation, and exercise can help you manage it.   Start Now! - Make changes one at a time and set reasonable goals to increase your likelihood of success.

## 2020-05-13 ENCOUNTER — Other Ambulatory Visit: Payer: Self-pay

## 2020-05-13 DIAGNOSIS — G8929 Other chronic pain: Secondary | ICD-10-CM

## 2020-05-13 DIAGNOSIS — M5440 Lumbago with sciatica, unspecified side: Secondary | ICD-10-CM

## 2020-05-13 MED ORDER — TADALAFIL 10 MG PO TABS: ORAL_TABLET | ORAL | 1 refills | Status: DC

## 2020-05-13 MED ORDER — CELECOXIB 200 MG PO CAPS: 200.0000 mg | ORAL_CAPSULE | Freq: Two times a day (BID) | ORAL | 2 refills | Status: DC

## 2020-05-20 NOTE — Telephone Encounter (Signed)
Patient has not returned calls. Closing encounter. 

## 2020-06-10 ENCOUNTER — Other Ambulatory Visit: Payer: Self-pay | Admitting: Internal Medicine

## 2020-06-10 DIAGNOSIS — F988 Other specified behavioral and emotional disorders with onset usually occurring in childhood and adolescence: Secondary | ICD-10-CM

## 2020-06-10 MED ORDER — AMPHETAMINE-DEXTROAMPHETAMINE 30 MG PO TABS: ORAL_TABLET | ORAL | 0 refills | Status: DC

## 2020-06-16 ENCOUNTER — Other Ambulatory Visit: Payer: Self-pay | Admitting: Internal Medicine

## 2020-07-08 ENCOUNTER — Other Ambulatory Visit: Payer: Self-pay | Admitting: Internal Medicine

## 2020-07-08 MED ORDER — TADALAFIL 20 MG PO TABS
ORAL_TABLET | ORAL | 0 refills | Status: DC
Start: 2020-07-08 — End: 2020-08-14

## 2020-07-28 ENCOUNTER — Other Ambulatory Visit: Payer: Self-pay | Admitting: Internal Medicine

## 2020-07-28 DIAGNOSIS — F988 Other specified behavioral and emotional disorders with onset usually occurring in childhood and adolescence: Secondary | ICD-10-CM

## 2020-07-28 MED ORDER — AMPHETAMINE-DEXTROAMPHETAMINE 30 MG PO TABS: ORAL_TABLET | ORAL | 0 refills | Status: DC

## 2020-08-07 ENCOUNTER — Encounter (HOSPITAL_BASED_OUTPATIENT_CLINIC_OR_DEPARTMENT_OTHER): Payer: Self-pay

## 2020-08-07 ENCOUNTER — Emergency Department (HOSPITAL_BASED_OUTPATIENT_CLINIC_OR_DEPARTMENT_OTHER)
Admission: EM | Admit: 2020-08-07 | Discharge: 2020-08-07 | Disposition: A | Discharge status: Home / Self Care | Payer: BC Managed Care – PPO | Attending: Emergency Medicine | Admitting: Emergency Medicine

## 2020-08-07 ENCOUNTER — Other Ambulatory Visit: Payer: Self-pay | Admitting: Internal Medicine

## 2020-08-07 ENCOUNTER — Other Ambulatory Visit: Payer: Self-pay

## 2020-08-07 ENCOUNTER — Other Ambulatory Visit (HOSPITAL_BASED_OUTPATIENT_CLINIC_OR_DEPARTMENT_OTHER): Payer: Self-pay | Admitting: Emergency Medicine

## 2020-08-07 DIAGNOSIS — L0211 Cutaneous abscess of neck: Secondary | ICD-10-CM | POA: Insufficient documentation

## 2020-08-07 DIAGNOSIS — F17210 Nicotine dependence, cigarettes, uncomplicated: Secondary | ICD-10-CM | POA: Insufficient documentation

## 2020-08-07 MED ORDER — HYDROCODONE-ACETAMINOPHEN 5-325 MG PO TABS
1.0000 | ORAL_TABLET | Freq: Once | ORAL | Status: AC
  Administered 2020-08-07: 1 via ORAL
  Filled 2020-08-07: qty 1

## 2020-08-07 MED ORDER — HYDROCODONE-ACETAMINOPHEN 5-325 MG PO TABS: 1.0000 | ORAL_TABLET | Freq: Four times a day (QID) | ORAL | 0 refills | Status: DC | PRN

## 2020-08-07 MED ORDER — IBUPROFEN 400 MG PO TABS
600.0000 mg | ORAL_TABLET | Freq: Once | ORAL | Status: AC
  Administered 2020-08-07: 600 mg via ORAL
  Filled 2020-08-07: qty 1

## 2020-08-07 MED ORDER — LIDOCAINE-EPINEPHRINE (PF) 2 %-1:200000 IJ SOLN
10.0000 mL | Freq: Once | INTRAMUSCULAR | Status: AC
  Administered 2020-08-07: 10 mL
  Filled 2020-08-07: qty 20

## 2020-08-07 MED ORDER — SULFAMETHOXAZOLE-TRIMETHOPRIM 800-160 MG PO TABS: 1.0000 | ORAL_TABLET | Freq: Two times a day (BID) | ORAL | 0 refills | Status: AC

## 2020-08-07 MED ORDER — SULFAMETHOXAZOLE-TRIMETHOPRIM 800-160 MG PO TABS: 1.0000 | ORAL_TABLET | Freq: Two times a day (BID) | ORAL | 0 refills | Status: DC

## 2020-08-07 MED FILL — SULFAMETHOXAZOLE-TMP DS TAB: 800-160 | 7 days supply | Qty: 14 | Fill #0

## 2020-08-07 MED FILL — HYDROCODON-APAP 5-325: 5-325 | 1 days supply | Qty: 6 | Fill #0

## 2020-08-07 NOTE — ED Notes (Signed)
ED Provider at bedside. 

## 2020-08-07 NOTE — ED Triage Notes (Addendum)
Pt states he had a bump under his neck like an ingrown hair, it went away, but now came back and is worse than before. Redness and swelling to right neck/chin with area that appears to be an abscess/wound in the center. States painful to swallow & difficulty breathing, however appears in no distress, controlling secretions, respirations even and unlabored.

## 2020-08-07 NOTE — ED Provider Notes (Addendum)
[facility] EMERGENCY DEPARTMENT Provider Note   CSN: [id number] Arrival date & time: 08/07/20  9509     History Chief Complaint  Patient presents with  . Abscess    William Willis is a 39 y.o. male.  HPI 39 year old male presents with right neck abscess.  States he had what seemed to be an ingrown hair.  This happened about a month ago he was given antibiotics but told he probably need to be lanced but I could not do it with urgent care.  It has not come back since yesterday.  No fevers.  It hurts to swallow.  Has difficulty breathing but has a chronic history of asthma.   Past Medical History:  Diagnosis Date  . ADD (attention deficit disorder)   . Anxiety   . Aortic insufficiency   . Pneumonia   . Tobacco abuse     Patient Active Problem List   Diagnosis Date Noted  . Iron deficiency 01/30/2020  . Chronic low back pain with sciatica 01/29/2020  . Erectile dysfunction 01/29/2020  . Marijuana smoker 01/29/2020  . Moderate depressive episode (HCC) 10/15/2019  . Seizure disorder (HCC) 12/13/2018  . Paresthesia 12/13/2018  . Aortic insufficiency 10/25/2018  . Mixed hyperlipidemia 05/10/2018  . Overweight (BMI 25.0-29.9) 05/10/2018  . Medication management 10/24/2017  . History of smoking 30 or more pack years 10/24/2017  . Anxiety state 12/04/2014  . Vitamin D deficiency 05/07/2014  . ADD (attention deficit disorder) 07/24/2013    Past Surgical History:  Procedure Laterality Date  . ADENOIDECTOMY    . SHOULDER SURGERY Right 12-08-11  . TONSILLECTOMY    . WISDOM TOOTH EXTRACTION  10/19/2017       Family History  Problem Relation Age of Onset  . Dementia Maternal Grandmother   . Heart attack Maternal Grandfather        x11  . Stroke Maternal Grandfather        x2  . Prostate cancer Maternal Grandfather   . Esophageal cancer Maternal Grandfather     Social History   Tobacco Use  . Smoking status: Current Every Day Smoker    Packs/day:  2.00    Years: 28.00    Pack years: 56.00    Types: Cigarettes    Start date: 89  . Smokeless tobacco: Never Used  . Tobacco comment: currently up to 2.5 packs in last 2-3 years  Vaping Use  . Vaping Use: Never used  Substance Use Topics  . Alcohol use: Yes    Comment: rare  . Drug use: Yes    Types: Marijuana    Comment: currently reports daily     Home Medications Prior to Admission medications   Medication Sig Start Date End Date Taking? Authorizing Provider  albuterol (VENTOLIN HFA) 108 (90 Base) MCG/ACT inhaler Inhale 1-2 puffs into the lungs every 4 (four) hours as needed for wheezing or shortness of breath. 01/29/20   Judd Gaudier, NP  amphetamine-dextroamphetamine (ADDERALL) 30 MG tablet Take      1/2 to 1or 2 tablets /day        & please try to limit to 5 days /week (10 tab/week = 6 weeks) to avoid addiction. (next refill due Jan 19) 07/28/20   Lucky Cowboy, MD  busPIRone (BUSPAR) 30 MG tablet Take 1/2 to 1 tablet 2 x /day for Anxiety 12/23/19   Judd Gaudier, NP  carbamazepine (TEGRETOL XR) 400 MG 12 hr tablet TAKE 1 TABLET(400 MG) BY MOUTH TWICE DAILY 12/23/19  Judd Gaudier, NP  celecoxib (CELEBREX) 200 MG capsule Take 1 capsule (200 mg total) by mouth 2 (two) times daily. 05/13/20 05/13/21  Doree Albee, PA-C  gabapentin (NEURONTIN) 300 MG capsule Take 1 to 2 capsules up to 3 x /day if needed for Nerve Pain 12/03/19   Judd Gaudier, NP  HYDROcodone-acetaminophen (NORCO) 5-325 MG tablet Take 1 tablet by mouth every 6 (six) hours as needed for severe pain. 08/07/20   Pricilla Loveless, MD  sulfamethoxazole-trimethoprim (BACTRIM DS) 800-160 MG tablet Take 1 tablet by mouth 2 (two) times daily for 7 days. 08/07/20 08/14/20  Pricilla Loveless, MD  tadalafil (CIALIS) 20 MG tablet Take      1/2 to 1 tablet       every 2 to 3 days         as needed for XXXX 07/08/20   Lucky Cowboy, MD  Vitamin D, Ergocalciferol, (DRISDOL) 1.25 MG (50000 UNIT) CAPS capsule Take one capsule  3 days a week for Vitamin D deficiency 01/30/20   Judd Gaudier, NP    Allergies    Patient has no known allergies.  Review of Systems   Review of Systems  Constitutional: Negative for fever.  Gastrointestinal: Negative for vomiting.  Musculoskeletal: Positive for neck pain.  Skin: Positive for color change.    Physical Exam Updated Vital Signs BP (!) 136/92 (BP Location: Right Arm)   Pulse 73   Temp 98.7 F (37.1 C) (Oral)   Resp 18   Ht 5\' 6"  (1.676 m)   Wt 72.6 kg   SpO2 100%   BMI 25.82 kg/m   Physical Exam Vitals and nursing note reviewed.  Constitutional:      Appearance: He is well-developed and well-nourished.  HENT:     Head: Normocephalic and atraumatic.     Right Ear: External ear normal.     Left Ear: External ear normal.     Nose: Nose normal.     Mouth/Throat:     Comments: No floor of the mouth swelling or other acute intra-oral process Eyes:     General:        Right eye: No discharge.        Left eye: No discharge.  Neck:   Cardiovascular:     Rate and Rhythm: Normal rate and regular rhythm.     Heart sounds: Normal heart sounds.  Pulmonary:     Effort: Pulmonary effort is normal.     Breath sounds: Normal breath sounds.  Abdominal:     General: There is no distension.  Musculoskeletal:        General: No edema.     Cervical back: Normal range of motion and neck supple. No rigidity.  Skin:    General: Skin is warm and dry.  Neurological:     Mental Status: He is alert.  Psychiatric:        Mood and Affect: Mood is not anxious.     ED Results / Procedures / Treatments   Labs (all labs ordered are listed, but only abnormal results are displayed) Labs Reviewed - No data to display  EKG None  Radiology No results found.  Procedures . Incision and Drainage  Date/Time: 08/07/2020 11:20 AM Performed by: 08/09/2020, MD Authorized by: Pricilla Loveless, MD   Consent:    Consent obtained:  Verbal   Consent given by:   Patient   Risks, benefits, and alternatives were discussed: yes     Risks discussed:  Incomplete drainage, bleeding, damage  to other organs, infection and pain Universal protocol:    Patient identity confirmed:  Verbally with patient Location:    Type:  Abscess   Size:  1 cm   Location:  Neck   Neck location:  R anterior Pre-procedure details:    Skin preparation:  Povidone-iodine Sedation:    Sedation type:  None Anesthesia:    Anesthesia method:  Local infiltration   Local anesthetic:  Lidocaine 2% WITH epi Procedure type:    Complexity:  Simple Procedure details:    Ultrasound guidance: yes     Incision types:  Single straight   Incision depth:  Dermal   Wound management:  Probed and deloculated   Drainage:  Purulent   Drainage amount:  Moderate   Wound treatment:  Wound left open   Packing materials:  None Post-procedure details:    Procedure completion:  Tolerated well, no immediate complications  Ultrasound ED Soft Tissue  Date/Time: 08/07/2020 11:25 AM Performed by: Pricilla Loveless, MD Authorized by: Pricilla Loveless, MD   Procedure details:    Indications: localization of abscess     Transverse view:  Visualized   Longitudinal view:  Visualized   Images: archived   Location:    Location: neck     Side:  Right Findings:     abscess present   (including critical care time)  Medications Ordered in ED Medications  HYDROcodone-acetaminophen (NORCO/VICODIN) 5-325 MG per tablet 1 tablet (1 tablet Oral Given 08/07/20 1059)  ibuprofen (ADVIL) tablet 600 mg (600 mg Oral Given 08/07/20 1059)  lidocaine-EPINEPHrine (XYLOCAINE W/EPI) 2 %-1:200000 (PF) injection 10 mL (10 mLs Infiltration Given 08/07/20 1059)    ED Course  I have reviewed the triage vital signs and the nursing notes.  Pertinent labs & imaging results that were available during my care of the patient were reviewed by me and considered in my medical decision making (see chart for details).    MDM  Rules/Calculators/A&P                          Patient presents with a skin abscess. It is lateral to midline and on bedside ultrasound I can see the entirety of the abscess which appears superficial. Does not appear close to significant blood vessels. At this point, I discussed with patient and we agreed to incise and drain. No immediate complications. He does have surrounding cellulitis and will place on Bactrim. Otherwise follow-up with PCP. Return if symptoms worsen. Final Clinical Impression(s) / ED Diagnoses Final diagnoses:  Abscess of skin of neck    Rx / DC Orders ED Discharge Orders         Ordered    sulfamethoxazole-trimethoprim (BACTRIM DS) 800-160 MG tablet  2 times daily,   Status:  Discontinued        08/07/20 1114    HYDROcodone-acetaminophen (NORCO) 5-325 MG tablet  Every 6 hours PRN,   Status:  Discontinued        08/07/20 1114    HYDROcodone-acetaminophen (NORCO) 5-325 MG tablet  Every 6 hours PRN        08/07/20 1115    sulfamethoxazole-trimethoprim (BACTRIM DS) 800-160 MG tablet  2 times daily        08/07/20 1115           Pricilla Loveless, MD 08/07/20 1125    Pricilla Loveless, MD 08/07/20 1125

## 2020-08-07 NOTE — Discharge Instructions (Signed)
If you develop fever, trouble swallowing or breathing, new or worsening swelling, or any other new/concerning symptoms then see your doctor or return to the ER.

## 2020-08-11 ENCOUNTER — Other Ambulatory Visit: Payer: Self-pay | Admitting: Internal Medicine

## 2020-08-11 DIAGNOSIS — F319 Bipolar disorder, unspecified: Secondary | ICD-10-CM | POA: Insufficient documentation

## 2020-08-11 MED ORDER — CARBAMAZEPINE ER 400 MG PO TB12: ORAL_TABLET | ORAL | 0 refills | Status: DC

## 2020-08-13 ENCOUNTER — Other Ambulatory Visit: Payer: Self-pay | Admitting: Internal Medicine

## 2020-08-28 ENCOUNTER — Ambulatory Visit (INDEPENDENT_AMBULATORY_CARE_PROVIDER_SITE_OTHER): Payer: Self-pay | Admitting: Adult Health

## 2020-08-28 ENCOUNTER — Encounter: Payer: Self-pay | Admitting: Adult Health

## 2020-08-28 ENCOUNTER — Other Ambulatory Visit: Payer: Self-pay

## 2020-08-28 VITALS — BP 120/80 | HR 73 | Temp 98.1°F | Wt 151.0 lb

## 2020-08-28 DIAGNOSIS — F32A Depression, unspecified: Secondary | ICD-10-CM

## 2020-08-28 DIAGNOSIS — E782 Mixed hyperlipidemia: Secondary | ICD-10-CM

## 2020-08-28 DIAGNOSIS — F988 Other specified behavioral and emotional disorders with onset usually occurring in childhood and adolescence: Secondary | ICD-10-CM

## 2020-08-28 DIAGNOSIS — G40909 Epilepsy, unspecified, not intractable, without status epilepticus: Secondary | ICD-10-CM

## 2020-08-28 DIAGNOSIS — E663 Overweight: Secondary | ICD-10-CM

## 2020-08-28 DIAGNOSIS — F319 Bipolar disorder, unspecified: Secondary | ICD-10-CM

## 2020-08-28 DIAGNOSIS — Z79899 Other long term (current) drug therapy: Secondary | ICD-10-CM

## 2020-08-28 DIAGNOSIS — E559 Vitamin D deficiency, unspecified: Secondary | ICD-10-CM

## 2020-08-28 DIAGNOSIS — F411 Generalized anxiety disorder: Secondary | ICD-10-CM

## 2020-08-28 DIAGNOSIS — F321 Major depressive disorder, single episode, moderate: Secondary | ICD-10-CM

## 2020-08-28 MED ORDER — AMPHETAMINE-DEXTROAMPHETAMINE 30 MG PO TABS: ORAL_TABLET | ORAL | 0 refills | Status: DC

## 2020-08-28 MED ORDER — CARBAMAZEPINE ER 200 MG PO TB12: ORAL_TABLET | ORAL | 1 refills | Status: DC

## 2020-08-28 MED ORDER — BUSPIRONE HCL 30 MG PO TABS: ORAL_TABLET | ORAL | 1 refills | Status: DC

## 2020-08-28 MED ORDER — CARBAMAZEPINE ER 200 MG PO TB12: ORAL_TABLET | ORAL | 0 refills | Status: DC

## 2020-08-28 MED ORDER — VITAMIN D (ERGOCALCIFEROL) 1.25 MG (50000 UNIT) PO CAPS: ORAL_CAPSULE | ORAL | 1 refills | Status: DC
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
## 2020-08-28 NOTE — Patient Instructions (Addendum)
Goals    . Reduce Smoking       Restart tegretol - 200 mg twice daily   Try to reduce smoking   Restart vitamin D TWICE a week  Schedule psych appointment      SMOKING CESSATION  [employer]  [phone] for more information or for a free program for smoking cessation help.   You can call QUIT SMART 1-800-QUIT-NOW for free nicotine patches or replacement therapy- if they are out- keep calling  [facility] Can call for smoking cessation classes, [phone]  If you have a smart phone, please look up Smoke Free app, this will help you stay on track and give you information about money you have saved, life that you have gained back and a ton of more information.     ADVANTAGES OF QUITTING SMOKING  Within 20 minutes, blood pressure decreases. Your pulse is at normal level.  After 8 hours, carbon monoxide levels in the blood return to normal. Your oxygen level increases.  After 24 hours, the chance of having a heart attack starts to decrease. Your breath, hair, and body stop smelling like smoke.  After 48 hours, damaged nerve endings begin to recover. Your sense of taste and smell improve.  After 72 hours, the body is virtually free of nicotine. Your bronchial tubes relax and breathing becomes easier.  After 2 to 12 weeks, lungs can hold more air. Exercise becomes easier and circulation improves.  After 1 year, the risk of coronary heart disease is cut in half.  After 5 years, the risk of stroke falls to the same as a nonsmoker.  After 10 years, the risk of lung cancer is cut in half and the risk of other cancers decreases significantly.  After 15 years, the risk of coronary heart disease drops, usually to the level of a nonsmoker.  You will have extra money to spend on things other than cigarettes.

## 2020-08-29 LAB — CBC WITH DIFFERENTIAL/PLATELET
Absolute Monocytes: 694 cells/uL (ref 200–950)
Basophils Absolute: 107 cells/uL (ref 0–200)
Basophils Relative: 1.2 %
Eosinophils Absolute: 169 cells/uL (ref 15–500)
Eosinophils Relative: 1.9 %
HCT: 43.9 % (ref 38.5–50.0)
Hemoglobin: 14.8 g/dL (ref 13.2–17.1)
Lymphs Abs: 2946 cells/uL (ref 850–3900)
MCH: 28.5 pg (ref 27.0–33.0)
MCHC: 33.7 g/dL (ref 32.0–36.0)
MCV: 84.4 fL (ref 80.0–100.0)
MPV: 10.5 fL (ref 7.5–12.5)
Monocytes Relative: 7.8 %
Neutro Abs: 4984 cells/uL (ref 1500–7800)
Neutrophils Relative %: 56 %
Platelets: 260 10*3/uL (ref 140–400)
RBC: 5.2 10*6/uL (ref 4.20–5.80)
RDW: 12.9 % (ref 11.0–15.0)
Total Lymphocyte: 33.1 %
WBC: 8.9 10*3/uL (ref 3.8–10.8)

## 2020-08-29 LAB — COMPLETE METABOLIC PANEL WITH GFR
AG Ratio: 2.1 (calc) (ref 1.0–2.5)
ALT: 13 U/L (ref 9–46)
AST: 16 U/L (ref 10–40)
Albumin: 4 g/dL (ref 3.6–5.1)
Alkaline phosphatase (APISO): 71 U/L (ref 36–130)
BUN: 13 mg/dL (ref 7–25)
CO2: 29 mmol/L (ref 20–32)
Calcium: 9.2 mg/dL (ref 8.6–10.3)
Chloride: 108 mmol/L (ref 98–110)
Creat: 0.91 mg/dL (ref 0.60–1.35)
GFR, Est African American: 123 mL/min/{1.73_m2} (ref >=60)
GFR, Est Non African American: 107 mL/min/{1.73_m2} (ref >=60)
Globulin: 1.9 g/dL (calc) (ref 1.9–3.7)
Glucose, Bld: 39 mg/dL — CL (ref 65–99)
Potassium: 4.2 mmol/L (ref 3.5–5.3)
Sodium: 142 mmol/L (ref 135–146)
Total Bilirubin: 0.3 mg/dL (ref 0.2–1.2)
Total Protein: 5.9 g/dL — ABNORMAL LOW (ref 6.1–8.1)

## 2020-08-29 LAB — TSH: TSH: 0.68 mIU/L (ref 0.40–4.50)

## 2020-08-31 ENCOUNTER — Ambulatory Visit: Payer: Self-pay | Admitting: Adult Health

## 2020-09-01 ENCOUNTER — Telehealth: Payer: Self-pay

## 2020-09-01 ENCOUNTER — Other Ambulatory Visit: Payer: Self-pay | Admitting: Adult Health

## 2020-09-01 DIAGNOSIS — F988 Other specified behavioral and emotional disorders with onset usually occurring in childhood and adolescence: Secondary | ICD-10-CM

## 2020-09-01 MED ORDER — AMPHETAMINE-DEXTROAMPHETAMINE 30 MG PO TABS: ORAL_TABLET | ORAL | 0 refills | Status: DC

## 2020-09-01 NOTE — Telephone Encounter (Signed)
States that the pharmacy won't fill his Adderall prescription.   Spoke with the pharmacist, was uncertain as to when the patient could start the prescription. Last prescription states that prescription shouldn't be filled until 1/17. Also, please clarify patient sig.

## 2020-09-18 ENCOUNTER — Other Ambulatory Visit: Payer: Self-pay | Admitting: Internal Medicine

## 2020-09-18 MED ORDER — TADALAFIL 20 MG PO TABS: ORAL_TABLET | ORAL | 0 refills | Status: DC

## 2020-09-25 ENCOUNTER — Other Ambulatory Visit: Payer: Self-pay

## 2020-09-25 DIAGNOSIS — G8929 Other chronic pain: Secondary | ICD-10-CM

## 2020-09-25 DIAGNOSIS — M5440 Lumbago with sciatica, unspecified side: Secondary | ICD-10-CM

## 2020-09-25 MED ORDER — CELECOXIB 200 MG PO CAPS: 200.0000 mg | ORAL_CAPSULE | Freq: Two times a day (BID) | ORAL | 2 refills | Status: DC

## 2020-09-25 NOTE — Telephone Encounter (Signed)
Refill on CELECOXIB  SENT [facility]

## 2020-10-01 ENCOUNTER — Telehealth: Payer: Self-pay

## 2020-10-01 ENCOUNTER — Other Ambulatory Visit: Payer: Self-pay | Admitting: Adult Health

## 2020-10-01 ENCOUNTER — Other Ambulatory Visit: Payer: Self-pay | Admitting: Internal Medicine

## 2020-10-01 DIAGNOSIS — F988 Other specified behavioral and emotional disorders with onset usually occurring in childhood and adolescence: Secondary | ICD-10-CM

## 2020-10-01 MED ORDER — AMPHETAMINE-DEXTROAMPHETAMINE 30 MG PO TABS: ORAL_TABLET | ORAL | 0 refills | Status: DC

## 2020-10-01 NOTE — Telephone Encounter (Signed)
Refill request for Adderall

## 2020-10-27 ENCOUNTER — Other Ambulatory Visit: Payer: Self-pay | Admitting: Adult Health

## 2020-10-27 ENCOUNTER — Telehealth: Payer: Self-pay

## 2020-10-27 NOTE — Telephone Encounter (Signed)
Adderall isn't due to be filled until Thursday, but he has to go out of town for work on Wednesday. Would like to fill this early please.

## 2020-10-28 MED ORDER — TADALAFIL 20 MG PO TABS: ORAL_TABLET | ORAL | 0 refills | Status: DC

## 2020-10-28 NOTE — Addendum Note (Signed)
Addended by: Dionicio Stall on: 10/28/2020 11:28 AM   Modules accepted: Orders

## 2020-10-28 NOTE — Telephone Encounter (Signed)
Patient aware and will call us when he reached his destination.  Also, requesting a refill for Cialis

## 2020-11-02 ENCOUNTER — Other Ambulatory Visit: Payer: Self-pay | Admitting: Adult Health

## 2020-11-02 ENCOUNTER — Telehealth: Payer: Self-pay

## 2020-11-02 DIAGNOSIS — F988 Other specified behavioral and emotional disorders with onset usually occurring in childhood and adolescence: Secondary | ICD-10-CM

## 2020-11-02 MED ORDER — AMPHETAMINE-DEXTROAMPHETAMINE 30 MG PO TABS: ORAL_TABLET | ORAL | 0 refills | Status: DC

## 2020-11-02 NOTE — Progress Notes (Signed)
Future Appointments  Date Time Provider Department Center  02/01/2021  3:00 PM Judd Gaudier, NP [facility] None    PDMP reviewed for adderall refill request.

## 2020-11-02 NOTE — Telephone Encounter (Signed)
Refill request for Adderall

## 2020-12-02 ENCOUNTER — Other Ambulatory Visit: Payer: Self-pay | Admitting: Adult Health

## 2020-12-02 DIAGNOSIS — F988 Other specified behavioral and emotional disorders with onset usually occurring in childhood and adolescence: Secondary | ICD-10-CM

## 2020-12-02 MED ORDER — AMPHETAMINE-DEXTROAMPHETAMINE 30 MG PO TABS: ORAL_TABLET | ORAL | 0 refills | Status: DC

## 2020-12-02 NOTE — Progress Notes (Signed)
Future Appointments  Date Time Provider Department Center  02/01/2021  3:00 PM Amiya Escamilla, NP [facility] None    PDMP reviewed for adderall refill request.   

## 2020-12-09 ENCOUNTER — Other Ambulatory Visit: Payer: Self-pay

## 2020-12-09 MED ORDER — TADALAFIL 20 MG PO TABS: ORAL_TABLET | ORAL | 0 refills | Status: DC

## 2021-01-04 ENCOUNTER — Other Ambulatory Visit: Payer: Self-pay | Admitting: Internal Medicine

## 2021-01-04 DIAGNOSIS — F988 Other specified behavioral and emotional disorders with onset usually occurring in childhood and adolescence: Secondary | ICD-10-CM

## 2021-01-04 MED ORDER — AMPHETAMINE-DEXTROAMPHETAMINE 30 MG PO TABS: ORAL_TABLET | ORAL | 0 refills | Status: DC

## 2021-02-01 ENCOUNTER — Encounter: Payer: Self-pay | Admitting: Adult Health

## 2021-02-08 ENCOUNTER — Ambulatory Visit: Admission: EM | Admit: 2021-02-08 | Discharge: 2021-02-08 | Discharge status: Against Medical Advice | Payer: Self-pay

## 2021-02-08 ENCOUNTER — Other Ambulatory Visit: Payer: Self-pay

## 2021-02-08 NOTE — ED Notes (Signed)
Per provider we do not refill adderall  here, pt notified and chose to leave.

## 2021-02-23 ENCOUNTER — Ambulatory Visit: Payer: Self-pay | Admitting: Medical-Surgical

## 2021-02-24 ENCOUNTER — Other Ambulatory Visit: Payer: Self-pay

## 2021-02-24 ENCOUNTER — Encounter: Payer: Self-pay | Admitting: Family Medicine

## 2021-02-24 ENCOUNTER — Ambulatory Visit (INDEPENDENT_AMBULATORY_CARE_PROVIDER_SITE_OTHER): Payer: Self-pay | Admitting: Family Medicine

## 2021-02-24 DIAGNOSIS — Z87891 Personal history of nicotine dependence: Secondary | ICD-10-CM

## 2021-02-24 DIAGNOSIS — G8929 Other chronic pain: Secondary | ICD-10-CM

## 2021-02-24 DIAGNOSIS — F411 Generalized anxiety disorder: Secondary | ICD-10-CM

## 2021-02-24 DIAGNOSIS — M5440 Lumbago with sciatica, unspecified side: Secondary | ICD-10-CM

## 2021-02-24 DIAGNOSIS — M545 Low back pain, unspecified: Secondary | ICD-10-CM

## 2021-02-24 DIAGNOSIS — F988 Other specified behavioral and emotional disorders with onset usually occurring in childhood and adolescence: Secondary | ICD-10-CM

## 2021-02-24 DIAGNOSIS — N529 Male erectile dysfunction, unspecified: Secondary | ICD-10-CM

## 2021-02-24 MED ORDER — BUSPIRONE HCL 30 MG PO TABS: ORAL_TABLET | ORAL | 1 refills | Status: DC

## 2021-02-24 MED ORDER — AMPHETAMINE-DEXTROAMPHETAMINE 30 MG PO TABS
ORAL_TABLET | ORAL | 0 refills | Status: DC
Start: 2021-02-24 — End: 2021-03-26

## 2021-02-24 MED ORDER — TADALAFIL 20 MG PO TABS: ORAL_TABLET | ORAL | 2 refills | Status: DC

## 2021-02-24 MED ORDER — GABAPENTIN 300 MG PO CAPS: ORAL_CAPSULE | ORAL | 3 refills | Status: DC

## 2021-02-24 NOTE — Assessment & Plan Note (Signed)
He will continue gabapentin as needed for this.  He also plans to schedule an appoint with Dr. Benjamin Stain for this.

## 2021-02-24 NOTE — Assessment & Plan Note (Signed)
He will continue BuSpar as needed.  He did mention that Valium worked well for him however I discussed with him that I would not recommend taking this as he is also taking a stimulant as well.  If needing additional medication or feels that he needs to be on benzodiazepine I would recommend referral to psychiatry.

## 2021-02-24 NOTE — Progress Notes (Signed)
William Willis - 40 y.o. male MRN [id number]  Date of birth: [birth year]  Subjective No chief complaint on file.   HPI William Willis is a 40 year old male here today for initial visit to establish care.  He has a history of ADD, generalized anxiety, bipolar disorder, chronic low back pain with radiculopathy, nicotine dependence.    Previous records reviewed as available through EMR from his previous primary care provider.  He has been treated with Adderall 30 mg twice daily for management of his ADD.  He has been on this for several years.  He reports that he was initially on 30 mg 3 times daily however his previous provider had changed this but he is unsure why.  He reports that the 30 mg twice per day is working okay for him at this time.  He works as a Games developer in Psychologist, forensic.  He does admit to marijuana use several times per week but denies any other drugs.  He has been using BuSpar for management of anxiety.  Currently taking 30 mg up to twice per day as needed.  He reports that he was taking Valium as well for management of his anxiety which works quite well for him.  He has tried several SSRIs in the past with limited improvement with these.  He takes gabapentin for chronic low back pain with sciatica.  This works pretty well for him.  He does tell me that he had been prescribed hydrocodone occasionally in the past for back pain.  He denies any weakness in his legs associated with his low back pain.  There is no bowel or bladder dysfunction.  ROS:  A comprehensive ROS was completed and negative except as noted per HPI  No Known Allergies  Past Medical History:  Diagnosis Date   ADD (attention deficit disorder)    Anxiety    Aortic insufficiency    Pneumonia    Tobacco abuse     Past Surgical History:  Procedure Laterality Date   ADENOIDECTOMY     SHOULDER SURGERY Right 12-08-11   TONSILLECTOMY     WISDOM TOOTH EXTRACTION  10/19/2017    Social History    Socioeconomic History   Marital status: Legally Separated    Spouse name: Not on file   Number of children: 2   Years of education: 12   Highest education level: High school graduate  Occupational History   Occupation: Works in Psychologist, forensic  Tobacco Use   Smoking status: Every Day    Packs/day: 2.00    Years: 29.00    Pack years: 58.00    Types: Cigarettes    Start date: 1993   Smokeless tobacco: Never   Tobacco comments:    currently up to 2-4 packs, average was 2 for many years  Vaping Use   Vaping Use: Never used  Substance and Sexual Activity   Alcohol use: Yes    Comment: rare   Drug use: Yes    Types: Marijuana    Comment: currently reports daily    Sexual activity: Yes    Partners: Female  Other Topics Concern   Not on file  Social History Narrative   Not on file   Social Determinants of Health   Financial Resource Strain: Not on file  Food Insecurity: Not on file  Transportation Needs: Not on file  Physical Activity: Not on file  Stress: Not on file  Social Connections: Not on file    Family History  Problem Relation Age  of Onset   Dementia Maternal Grandmother    Hypertension Maternal Grandfather    Diabetes Maternal Grandfather    Heart attack Maternal Grandfather        x11   Stroke Maternal Grandfather        x2   Prostate cancer Maternal Grandfather    Esophageal cancer Maternal Grandfather     Health Maintenance  Topic Date Due   COVID-19 Vaccine (1) Never done   Pneumococcal Vaccine 42-28 Years old (1 - PCV) Never done   Hepatitis C Screening  Never done   INFLUENZA VACCINE  03/22/2021   TETANUS/TDAP  02/07/2025   HIV Screening  Completed   HPV VACCINES  Aged Out     ----------------------------------------------------------------------------------------------------------------------------------------------------------------------------------------------------------------- Physical Exam BP 130/83   Pulse 91   Ht 5\' 6"  (1.676  m)   Wt 150 lb (68 kg)   BMI 24.21 kg/m   Physical Exam Constitutional:      Appearance: Normal appearance.  Eyes:     General: No scleral icterus. Cardiovascular:     Rate and Rhythm: Normal rate and regular rhythm.  Pulmonary:     Effort: Pulmonary effort is normal.     Breath sounds: Normal breath sounds.  Musculoskeletal:     Cervical back: Neck supple.  Neurological:     General: No focal deficit present.     Mental Status: He is alert.  Psychiatric:        Mood and Affect: Mood normal.        Behavior: Behavior normal.    ------------------------------------------------------------------------------------------------------------------------------------------------------------------------------------------------------------------- Assessment and Plan  Chronic low back pain with sciatica He will continue gabapentin as needed for this.  He also plans to schedule an appoint with Dr. for this.  ADD (attention deficit disorder) Previous records reviewed.  PDMP reviewed.  I discussed with him that I would continue his Adderall at 30 mg twice per day.  Prescription renewed today.  Anxiety state He will continue BuSpar as needed.  He did mention that Valium worked well for him however I discussed with him that I would not recommend taking this as he is also taking a stimulant as well.  If needing additional medication or feels that he needs to be on benzodiazepine I would recommend referral to psychiatry.  Erectile dysfunction Tadalafil renewed.  He is aware of side effects related to this as well as avoidance of nitrates.  History of smoking 30 or more pack years Counseled on smoking cessation.  He is not ready to quit at this time.   Meds ordered this encounter  Medications   amphetamine-dextroamphetamine (ADDERALL) 30 MG tablet    Sig: Take 1-2 tablets daily    Dispense:  60 tablet    Refill:  0   busPIRone (BUSPAR) 30 MG tablet    Sig: Take 1/2 to 1  tablet 2 x /day for Anxiety    Dispense:  180 tablet    Refill:  1   tadalafil (CIALIS) 20 MG tablet    Sig: Take 1/2-1 tab po every other day as needed.    Dispense:  45 tablet    Refill:  2   gabapentin (NEURONTIN) 300 MG capsule    Sig: Take 1 to 2 capsules up to 3 x /day if needed for Nerve Pain    Dispense:  180 capsule    Refill:  3    Return in about 3 months (around 05/27/2021) for ADD.    This visit occurred during the SARS-CoV-2 public  health emergency.  Safety protocols were in place, including screening questions prior to the visit, additional usage of staff PPE, and extensive cleaning of exam room while observing appropriate contact time as indicated for disinfecting solutions.

## 2021-02-24 NOTE — Patient Instructions (Signed)
Medications sent to your pharmacy See me again in 3 months.

## 2021-02-24 NOTE — Assessment & Plan Note (Signed)
Tadalafil renewed.  He is aware of side effects related to this as well as avoidance of nitrates.

## 2021-02-24 NOTE — Assessment & Plan Note (Signed)
Previous records reviewed.  PDMP reviewed.  I discussed with him that I would continue his Adderall at 30 mg twice per day.  Prescription renewed today.

## 2021-02-24 NOTE — Assessment & Plan Note (Signed)
Counseled on smoking cessation.  He is not ready to quit at this time. 

## 2021-03-10 ENCOUNTER — Ambulatory Visit (INDEPENDENT_AMBULATORY_CARE_PROVIDER_SITE_OTHER): Payer: Self-pay | Admitting: Sports Medicine

## 2021-03-10 ENCOUNTER — Other Ambulatory Visit: Payer: Self-pay

## 2021-03-10 DIAGNOSIS — M545 Low back pain, unspecified: Secondary | ICD-10-CM

## 2021-03-10 DIAGNOSIS — M5416 Radiculopathy, lumbar region: Secondary | ICD-10-CM

## 2021-03-10 MED ORDER — PREDNISONE 50 MG PO TABS: ORAL_TABLET | ORAL | 0 refills | Status: DC

## 2021-03-10 MED ORDER — GABAPENTIN 800 MG PO TABS: ORAL_TABLET | ORAL | 11 refills | Status: DC
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
## 2021-03-26 ENCOUNTER — Other Ambulatory Visit: Payer: Self-pay

## 2021-03-26 DIAGNOSIS — F988 Other specified behavioral and emotional disorders with onset usually occurring in childhood and adolescence: Secondary | ICD-10-CM

## 2021-03-26 MED ORDER — AMPHETAMINE-DEXTROAMPHETAMINE 30 MG PO TABS: ORAL_TABLET | ORAL | 0 refills | Status: DC

## 2021-04-28 ENCOUNTER — Other Ambulatory Visit: Payer: Self-pay | Admitting: *Deleted

## 2021-04-28 DIAGNOSIS — F988 Other specified behavioral and emotional disorders with onset usually occurring in childhood and adolescence: Secondary | ICD-10-CM

## 2021-04-28 MED ORDER — AMPHETAMINE-DEXTROAMPHETAMINE 30 MG PO TABS: ORAL_TABLET | ORAL | 0 refills | Status: DC

## 2021-05-31 ENCOUNTER — Other Ambulatory Visit: Payer: Self-pay | Admitting: Family Medicine

## 2021-05-31 DIAGNOSIS — F988 Other specified behavioral and emotional disorders with onset usually occurring in childhood and adolescence: Secondary | ICD-10-CM

## 2021-05-31 MED ORDER — AMPHETAMINE-DEXTROAMPHETAMINE 30 MG PO TABS: ORAL_TABLET | ORAL | 0 refills | Status: DC

## 2021-05-31 NOTE — Telephone Encounter (Signed)
Pt needs refill on his Adderall.  Thank you.

## 2021-05-31 NOTE — Telephone Encounter (Signed)
Rx renewed but needs f/u appt.

## 2021-06-01 NOTE — Telephone Encounter (Signed)
Pt informed of RX and given f/u appt for 06/09/2021.  Tiajuana Amass, CMA

## 2021-06-09 ENCOUNTER — Encounter: Payer: Self-pay | Admitting: Family Medicine

## 2021-06-09 ENCOUNTER — Telehealth (INDEPENDENT_AMBULATORY_CARE_PROVIDER_SITE_OTHER): Payer: Self-pay | Admitting: Family Medicine

## 2021-06-09 DIAGNOSIS — F988 Other specified behavioral and emotional disorders with onset usually occurring in childhood and adolescence: Secondary | ICD-10-CM

## 2021-06-09 DIAGNOSIS — M5416 Radiculopathy, lumbar region: Secondary | ICD-10-CM

## 2021-06-09 MED ORDER — METAXALONE 800 MG PO TABS
800.0000 mg | ORAL_TABLET | Freq: Three times a day (TID) | ORAL | 0 refills | Status: DC | PRN
Start: 2021-06-09 — End: 2022-08-24

## 2021-06-09 NOTE — Assessment & Plan Note (Signed)
He has chronic low back pain with radicular symptoms.  Discussed trying Skelaxin due to less sedating nature.  Prescription sent in.
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
## 2021-06-09 NOTE — Assessment & Plan Note (Signed)
He will continue Adderall at current strength and dosing.  Follow-up in 3 months.

## 2021-07-02 ENCOUNTER — Other Ambulatory Visit: Payer: Self-pay

## 2021-07-02 DIAGNOSIS — F988 Other specified behavioral and emotional disorders with onset usually occurring in childhood and adolescence: Secondary | ICD-10-CM

## 2021-07-02 MED ORDER — AMPHETAMINE-DEXTROAMPHETAMINE 30 MG PO TABS: ORAL_TABLET | ORAL | 0 refills | Status: DC

## 2021-08-02 ENCOUNTER — Other Ambulatory Visit: Payer: Self-pay

## 2021-08-02 DIAGNOSIS — F988 Other specified behavioral and emotional disorders with onset usually occurring in childhood and adolescence: Secondary | ICD-10-CM

## 2021-08-02 MED ORDER — AMPHETAMINE-DEXTROAMPHETAMINE 30 MG PO TABS: ORAL_TABLET | ORAL | 0 refills | Status: DC

## 2021-09-03 ENCOUNTER — Other Ambulatory Visit: Payer: Self-pay

## 2021-09-03 DIAGNOSIS — F988 Other specified behavioral and emotional disorders with onset usually occurring in childhood and adolescence: Secondary | ICD-10-CM

## 2021-09-03 MED ORDER — AMPHETAMINE-DEXTROAMPHETAMINE 30 MG PO TABS: ORAL_TABLET | ORAL | 0 refills | Status: DC

## 2021-10-13 ENCOUNTER — Other Ambulatory Visit: Payer: Self-pay

## 2021-10-13 DIAGNOSIS — F988 Other specified behavioral and emotional disorders with onset usually occurring in childhood and adolescence: Secondary | ICD-10-CM

## 2021-10-13 MED ORDER — AMPHETAMINE-DEXTROAMPHETAMINE 30 MG PO TABS: ORAL_TABLET | ORAL | 0 refills | Status: DC

## 2021-10-13 NOTE — Progress Notes (Signed)
Completed.

## 2021-10-14 ENCOUNTER — Telehealth: Payer: Self-pay

## 2021-10-14 DIAGNOSIS — F988 Other specified behavioral and emotional disorders with onset usually occurring in childhood and adolescence: Secondary | ICD-10-CM

## 2021-10-14 MED ORDER — AMPHETAMINE-DEXTROAMPHETAMINE 30 MG PO TABS: ORAL_TABLET | ORAL | 0 refills | Status: DC

## 2021-10-14 NOTE — Addendum Note (Signed)
Addended by: Perlie Mayo on: 10/14/2021 08:49 AM   Modules accepted: Orders

## 2021-10-14 NOTE — Telephone Encounter (Signed)
Needs adderall rx sent to the [facility] in [place] because they do not have stock at his pharmacy

## 2021-10-14 NOTE — Telephone Encounter (Signed)
Completed.    Thanks

## 2021-11-11 ENCOUNTER — Other Ambulatory Visit: Payer: Self-pay

## 2021-11-11 DIAGNOSIS — F988 Other specified behavioral and emotional disorders with onset usually occurring in childhood and adolescence: Secondary | ICD-10-CM

## 2021-11-11 MED ORDER — AMPHETAMINE-DEXTROAMPHETAMINE 30 MG PO TABS: ORAL_TABLET | ORAL | 0 refills | Status: DC

## 2021-11-11 NOTE — Progress Notes (Signed)
Completed.

## 2021-11-24 ENCOUNTER — Other Ambulatory Visit: Payer: Self-pay | Admitting: Family Medicine

## 2021-11-25 NOTE — Telephone Encounter (Signed)
Patient has been scheduled for 12/27/2021. ?

## 2021-11-25 NOTE — Telephone Encounter (Signed)
Pls contact patient to schedule follow-up with Dr. Ashley Royalty. Past due for 3 month follow-up for mood. Thanks ?

## 2021-11-28 ENCOUNTER — Other Ambulatory Visit: Payer: Self-pay

## 2021-11-28 ENCOUNTER — Emergency Department (HOSPITAL_BASED_OUTPATIENT_CLINIC_OR_DEPARTMENT_OTHER)
Admission: EM | Admit: 2021-11-28 | Discharge: 2021-11-28 | Disposition: A | Discharge status: Home / Self Care | Payer: Self-pay | Attending: Emergency Medicine | Admitting: Emergency Medicine

## 2021-11-28 ENCOUNTER — Encounter (HOSPITAL_BASED_OUTPATIENT_CLINIC_OR_DEPARTMENT_OTHER): Payer: Self-pay | Admitting: Emergency Medicine

## 2021-11-28 DIAGNOSIS — M25521 Pain in right elbow: Secondary | ICD-10-CM

## 2021-11-28 DIAGNOSIS — S40812A Abrasion of left upper arm, initial encounter: Secondary | ICD-10-CM | POA: Insufficient documentation

## 2021-11-28 DIAGNOSIS — S50311A Abrasion of right elbow, initial encounter: Secondary | ICD-10-CM | POA: Insufficient documentation

## 2021-11-28 DIAGNOSIS — S60511A Abrasion of right hand, initial encounter: Secondary | ICD-10-CM | POA: Insufficient documentation

## 2021-11-28 DIAGNOSIS — Y92410 Unspecified street and highway as the place of occurrence of the external cause: Secondary | ICD-10-CM | POA: Insufficient documentation

## 2021-11-28 DIAGNOSIS — T148XXA Other injury of unspecified body region, initial encounter: Secondary | ICD-10-CM

## 2021-11-28 DIAGNOSIS — S0081XA Abrasion of other part of head, initial encounter: Secondary | ICD-10-CM | POA: Insufficient documentation

## 2021-11-28 DIAGNOSIS — S50811A Abrasion of right forearm, initial encounter: Secondary | ICD-10-CM | POA: Insufficient documentation

## 2021-11-28 MED ORDER — BACITRACIN ZINC 500 UNIT/GM EX OINT: TOPICAL_OINTMENT | Freq: Two times a day (BID) | CUTANEOUS | Status: DC

## 2021-11-28 MED ORDER — METHOCARBAMOL 500 MG PO TABS: 500.0000 mg | ORAL_TABLET | Freq: Two times a day (BID) | ORAL | 0 refills | Status: DC

## 2021-11-28 MED ORDER — OXYCODONE-ACETAMINOPHEN 5-325 MG PO TABS
1.0000 | ORAL_TABLET | Freq: Once | ORAL | Status: AC
  Administered 2021-11-28: 1 via ORAL
  Filled 2021-11-28: qty 1

## 2021-11-28 MED ORDER — BACITRACIN ZINC 500 UNIT/GM EX OINT
TOPICAL_OINTMENT | CUTANEOUS | Status: AC
  Administered 2021-11-28: 1 via TOPICAL
  Filled 2021-11-28: qty 28.35

## 2021-11-28 NOTE — Discharge Instructions (Addendum)
Please keep an eye on your symptoms.  I think it is very reasonable that you deferred getting any x-rays today however if you have any new or worsening pain you may always return to the emergency for reevaluation.  Keep in mind that tomorrow he will likely wake up and have worsening pain when you are moving out of bed.  Make sure you hydrate, take a dose of ibuprofen and the muscle relaxer and do some gentle stretching.  Massage can be helpful as well. ? ?I have written you a prescription for Robaxin which is a muscle relaxer.  Also for bacitracin ointment as you can apply this to the multiple areas of abrasions on your body. ? ?Please use Tylenol or ibuprofen for pain.  You may use 600 mg ibuprofen every 6 hours or 1000 mg of Tylenol every 6 hours.  You may choose to alternate between the 2.  This would be most effective.  Not to exceed 4 g of Tylenol within 24 hours.  Not to exceed 3200 mg ibuprofen 24 hours.  ? ? ?
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
## 2021-11-28 NOTE — ED Notes (Signed)
Bacitracin, gauze and kerlix applied to head and RT hand wounds ?
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
## 2021-11-29 ENCOUNTER — Telehealth: Payer: Self-pay | Admitting: General Practice

## 2021-11-29 NOTE — Telephone Encounter (Signed)
Transition Care Management Unsuccessful Follow-up Telephone Call ? ?Date of discharge and from where:  11/28/21 from [facility] ? ?Attempts:  1st Attempt ? ?Reason for unsuccessful TCM follow-up call:  Unable to leave message voicemail not set up ? ?  ?

## 2021-12-01 NOTE — Telephone Encounter (Signed)
Transition Care Management Unsuccessful Follow-up Telephone Call ? ?Date of discharge and from where:  11/28/21 from [facility] ? ?Attempts:  2nd Attempt ? ?Reason for unsuccessful TCM follow-up call:  Unable to leave message ? ?  ?

## 2021-12-06 NOTE — Telephone Encounter (Signed)
Transition Care Management Unsuccessful Follow-up Telephone Call ? ?Date of discharge and from where:  11/28/21 from [facility] med center ? ?Attempts:  3rd Attempt ? ?Reason for unsuccessful TCM follow-up call:  Unable to leave message ? ?  ?

## 2021-12-14 ENCOUNTER — Other Ambulatory Visit: Payer: Self-pay

## 2021-12-14 DIAGNOSIS — F988 Other specified behavioral and emotional disorders with onset usually occurring in childhood and adolescence: Secondary | ICD-10-CM

## 2021-12-14 MED ORDER — AMPHETAMINE-DEXTROAMPHETAMINE 30 MG PO TABS: ORAL_TABLET | ORAL | 0 refills | Status: DC

## 2021-12-27 ENCOUNTER — Ambulatory Visit: Payer: Self-pay | Admitting: Family Medicine

## 2021-12-30 ENCOUNTER — Other Ambulatory Visit: Payer: Self-pay | Admitting: Family Medicine

## 2021-12-30 NOTE — Telephone Encounter (Signed)
Pt already has an appt scheduled for 01/05/22 ?

## 2021-12-30 NOTE — Telephone Encounter (Signed)
Please contact patient to schedule app for ADD.  8-month follow-up is past due. Thanks ?

## 2022-01-05 ENCOUNTER — Ambulatory Visit (INDEPENDENT_AMBULATORY_CARE_PROVIDER_SITE_OTHER): Payer: Self-pay | Admitting: Family Medicine

## 2022-01-05 ENCOUNTER — Encounter: Payer: Self-pay | Admitting: Family Medicine

## 2022-01-05 DIAGNOSIS — F411 Generalized anxiety disorder: Secondary | ICD-10-CM

## 2022-01-05 DIAGNOSIS — F988 Other specified behavioral and emotional disorders with onset usually occurring in childhood and adolescence: Secondary | ICD-10-CM

## 2022-01-05 MED ORDER — AMPHETAMINE-DEXTROAMPHETAMINE 30 MG PO TABS: ORAL_TABLET | ORAL | 0 refills | Status: DC

## 2022-01-05 MED ORDER — CITALOPRAM HYDROBROMIDE 20 MG PO TABS: 20.0000 mg | ORAL_TABLET | Freq: Every day | ORAL | 3 refills | Status: DC

## 2022-01-05 MED ORDER — AMPHETAMINE-DEXTROAMPHET ER 20 MG PO CP24: 20.0000 mg | ORAL_CAPSULE | Freq: Every day | ORAL | 0 refills | Status: DC

## 2022-01-05 NOTE — Patient Instructions (Signed)
Start citalopram 1/2 tab daily for 1 week then increase to a full tab ?Start adderall xr in the morning with adderall short acting in the afternoon if needed.  ?See me again in 6 weeks.  ?

## 2022-01-09 NOTE — Assessment & Plan Note (Signed)
Staring celexa daily.  Continue buspar and tegretol.  Recommend seeing a therapist but cost is too much at this time as he is uninsured.  Return in about 6 weeks (around 02/16/2022) for f/u GAD.

## 2022-01-09 NOTE — Assessment & Plan Note (Signed)
Adderall xr and adderall ir in the afternoon continues to work well for him.  We'll continue at current strength.
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
## 2022-01-21 ENCOUNTER — Other Ambulatory Visit: Payer: Self-pay

## 2022-01-21 DIAGNOSIS — F988 Other specified behavioral and emotional disorders with onset usually occurring in childhood and adolescence: Secondary | ICD-10-CM

## 2022-01-21 DIAGNOSIS — M5416 Radiculopathy, lumbar region: Secondary | ICD-10-CM

## 2022-01-21 MED ORDER — GABAPENTIN 800 MG PO TABS: ORAL_TABLET | ORAL | 1 refills | Status: DC

## 2022-01-21 NOTE — Telephone Encounter (Signed)
Patient's fiance called after hours requesting med refills for valium (not listed in active med list), Adderall and gabapentin for patient. Adderall rx pended. Please send to [facility].

## 2022-01-28 NOTE — Telephone Encounter (Signed)
Task completed. Left a detailed vm msg for patient regarding provider's note. Direct call back info provided.  

## 2022-02-07 ENCOUNTER — Other Ambulatory Visit: Payer: Self-pay | Admitting: Family Medicine

## 2022-02-07 ENCOUNTER — Other Ambulatory Visit: Payer: Self-pay

## 2022-02-07 DIAGNOSIS — F988 Other specified behavioral and emotional disorders with onset usually occurring in childhood and adolescence: Secondary | ICD-10-CM

## 2022-02-07 MED ORDER — AMPHETAMINE-DEXTROAMPHET ER 20 MG PO CP24: 20.0000 mg | ORAL_CAPSULE | Freq: Every day | ORAL | 0 refills | Status: DC

## 2022-02-07 MED ORDER — AMPHETAMINE-DEXTROAMPHETAMINE 30 MG PO TABS: ORAL_TABLET | ORAL | 0 refills | Status: DC

## 2022-02-07 NOTE — Progress Notes (Signed)
Completed.

## 2022-02-21 ENCOUNTER — Other Ambulatory Visit: Payer: Self-pay

## 2022-02-21 DIAGNOSIS — F988 Other specified behavioral and emotional disorders with onset usually occurring in childhood and adolescence: Secondary | ICD-10-CM

## 2022-02-22 MED ORDER — AMPHETAMINE-DEXTROAMPHETAMINE 30 MG PO TABS: ORAL_TABLET | ORAL | 0 refills | Status: DC

## 2022-02-22 NOTE — Progress Notes (Signed)
Completed.

## 2022-03-14 ENCOUNTER — Other Ambulatory Visit: Payer: Self-pay

## 2022-03-14 DIAGNOSIS — F988 Other specified behavioral and emotional disorders with onset usually occurring in childhood and adolescence: Secondary | ICD-10-CM

## 2022-03-15 MED ORDER — TADALAFIL 20 MG PO TABS: ORAL_TABLET | ORAL | 0 refills | Status: DC

## 2022-03-15 MED ORDER — AMPHETAMINE-DEXTROAMPHETAMINE 30 MG PO TABS: ORAL_TABLET | ORAL | 0 refills | Status: DC

## 2022-03-21 ENCOUNTER — Other Ambulatory Visit: Payer: Self-pay

## 2022-03-21 MED ORDER — AMPHETAMINE-DEXTROAMPHET ER 20 MG PO CP24: 20.0000 mg | ORAL_CAPSULE | Freq: Every day | ORAL | 0 refills | Status: DC

## 2022-03-21 NOTE — Telephone Encounter (Signed)
Last visit 01/05/2022 last fill 02/07/2022

## 2022-03-23 ENCOUNTER — Other Ambulatory Visit: Payer: Self-pay | Admitting: Family Medicine

## 2022-03-23 DIAGNOSIS — M5416 Radiculopathy, lumbar region: Secondary | ICD-10-CM

## 2022-04-21 ENCOUNTER — Other Ambulatory Visit: Payer: Self-pay

## 2022-04-21 ENCOUNTER — Telehealth: Payer: Self-pay | Admitting: Family Medicine

## 2022-04-21 DIAGNOSIS — F988 Other specified behavioral and emotional disorders with onset usually occurring in childhood and adolescence: Secondary | ICD-10-CM

## 2022-04-21 MED ORDER — TADALAFIL 20 MG PO TABS: ORAL_TABLET | ORAL | 0 refills | Status: DC

## 2022-04-21 MED ORDER — AMPHETAMINE-DEXTROAMPHET ER 20 MG PO CP24: 20.0000 mg | ORAL_CAPSULE | Freq: Every day | ORAL | 0 refills | Status: DC

## 2022-04-21 MED ORDER — AMPHETAMINE-DEXTROAMPHETAMINE 30 MG PO TABS: ORAL_TABLET | ORAL | 0 refills | Status: DC

## 2022-04-21 NOTE — Telephone Encounter (Signed)
Mother called. Pt is requesting refill on his Adderall, he's completely out.

## 2022-04-28 NOTE — Telephone Encounter (Signed)
Per pt's mother, William Willis states Adderall is not working as it used to.   Advised mother that William Willis needs an appointment to discuss his concerns.   Please call William Willis and schedule an appt with Dr. Ashley Royalty for ADHD. Thanks

## 2022-04-28 NOTE — Telephone Encounter (Signed)
Agree with appt.  Additionally the patient should call in prior to running out of medication.    CM

## 2022-04-29 NOTE — Telephone Encounter (Signed)
Contacted patient and scheduled appointment virtually per Panya. Katha Hamming

## 2022-05-02 ENCOUNTER — Encounter: Payer: Self-pay | Admitting: Family Medicine

## 2022-05-02 ENCOUNTER — Telehealth (INDEPENDENT_AMBULATORY_CARE_PROVIDER_SITE_OTHER): Payer: Self-pay | Admitting: Family Medicine

## 2022-05-02 DIAGNOSIS — F988 Other specified behavioral and emotional disorders with onset usually occurring in childhood and adolescence: Secondary | ICD-10-CM

## 2022-05-02 MED ORDER — AMPHETAMINE-DEXTROAMPHETAMINE 30 MG PO TABS: 30.0000 mg | ORAL_TABLET | Freq: Three times a day (TID) | ORAL | 0 refills | Status: DC

## 2022-05-02 NOTE — Progress Notes (Signed)
Pt would like to take all Regular Adderall. The time release causes up and down effects.

## 2022-05-02 NOTE — Progress Notes (Signed)
William Willis - 41 y.o. male MRN [id number]  Date of birth: [birth year]   This visit type was conducted due to national recommendations for restrictions regarding the COVID-19 Pandemic (e.g. social distancing).  This format is felt to be most appropriate for this patient at this time.  All issues noted in this document were discussed and addressed.  No physical exam was performed (except for noted visual exam findings with Video Visits).  I discussed the limitations of evaluation and management by telemedicine and the availability of in person appointments. The patient expressed understanding and agreed to proceed.  I connected withNAME@ on 05/02/22 at  1:10 PM EDT by a video enabled telemedicine application and verified that I am speaking with the correct person using two identifiers.  Present at visit: Everrett Coombe, DO [name]   Patient Location: Home [address] [place] [address]   Provider location:   [facility]  Chief Complaint  Patient presents with   Medication Refill    HPI  William Willis is a 41 y.o. male who presents via audio/video conferencing for a telehealth visit today.  Reports that he would like to discuss change from adderall xr back to adderall ir.  He was previously taking 30mg  BID.  Would like to try increase to TID dosing.  He reports that symptoms are "up and down" with extended release.  Denies any side effects.     ROS:  A comprehensive ROS was completed and negative except as noted per HPI  Past Medical History:  Diagnosis Date   ADD (attention deficit disorder)    Anxiety    Aortic insufficiency    Pneumonia    Tobacco abuse     Past Surgical History:  Procedure Laterality Date   ADENOIDECTOMY     SHOULDER SURGERY Right 12-08-11   TONSILLECTOMY     WISDOM TOOTH EXTRACTION  10/19/2017    Family History  Problem Relation Age of Onset   Dementia Maternal Grandmother    Hypertension Maternal Grandfather    Diabetes  Maternal Grandfather    Heart attack Maternal Grandfather        x11   Stroke Maternal Grandfather        x2   Prostate cancer Maternal Grandfather    Esophageal cancer Maternal Grandfather     Social History   Socioeconomic History   Marital status: Legally Separated    Spouse name: Not on file   Number of children: 2   Years of education: 12   Highest education level: High school graduate  Occupational History   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  Tobacco Use   Smoking status: Every Day    Packs/day: 2.00    Years: 29.00    Total pack years: 58.00    Types: Cigarettes    Start date: 1993   Smokeless tobacco: Never   Tobacco comments:    currently up to 2-4 packs, average was 2 for many years  Vaping Use   Vaping Use: Some days  Substance and Sexual Activity   Alcohol use: Yes    Comment: rare   Drug use: Yes    Types: Marijuana    Comment: currently reports daily    Sexual activity: Yes    Partners: Female  Other Topics Concern   Not on file  Social History Narrative   Not on file   Social Determinants of Health   Financial Resource Strain: Not on file  Food Insecurity: Not on file  Transportation Needs: Not on file  Physical Activity: Not on file  Stress: Not on file  Social Connections: Not on file  Intimate Partner Violence: Not on file     Current Outpatient Medications:    albuterol (VENTOLIN HFA) 108 (90 Base) MCG/ACT inhaler, Inhale 1-2 puffs into the lungs every 4 (four) hours as needed for wheezing or shortness of breath., Disp: 18 g, Rfl: 1   amphetamine-dextroamphetamine (ADDERALL) 30 MG tablet, Take 1 tab in the afternoon as needed., Disp: 30 tablet, Rfl: 0   amphetamine-dextroamphetamine (ADDERALL) 30 MG tablet, Take 1 tablet by mouth 3 (three) times daily., Disp: 90 tablet, Rfl: 0   busPIRone (BUSPAR) 30 MG tablet, Take 1/2 to 1 tablet 2 x /day for Anxiety, Disp: 180 tablet, Rfl: 1   carbamazepine (TEGRETOL) 200 MG tablet, Take 200 mg by  mouth 3 (three) times daily., Disp: , Rfl:    citalopram (CELEXA) 20 MG tablet, Take 1 tablet (20 mg total) by mouth daily. Start 1/2 tab daily x1 week then increase to a full tab., Disp: 30 tablet, Rfl: 3   gabapentin (NEURONTIN) 800 MG tablet, TAKE 1 TABLET BY MOUTH NIGHTLY AND MAY TAKE UP TO 3 TIMES DAILY, NOT TO EXCEED 4 TABLETS DAILY., Disp: 120 tablet, Rfl: 1   metaxalone (SKELAXIN) 800 MG tablet, Take 1 tablet (800 mg total) by mouth 3 (three) times daily as needed for muscle spasms., Disp: 90 tablet, Rfl: 0   methocarbamol (ROBAXIN) 500 MG tablet, Take 1 tablet (500 mg total) by mouth 2 (two) times daily., Disp: 20 tablet, Rfl: 0   tadalafil (CIALIS) 20 MG tablet, TAKE 1/2 TO 1 TABLET BY MOUTH EVERY OTHER DAY AS NEEDED, Disp: 20 tablet, Rfl: 0  EXAM:  VITALS per patient if applicable: Ht 5\' 6"  (1.676 m)   Wt 153 lb (69.4 kg)   BMI 24.69 kg/m   GENERAL: alert, oriented, appears well and in no acute distress  HEENT: atraumatic, conjunttiva clear, no obvious abnormalities on inspection of external nose and ears  NECK: normal movements of the head and neck  LUNGS: on inspection no signs of respiratory distress, breathing rate appears normal, no obvious gross SOB, gasping or wheezing  CV: no obvious cyanosis  MS: moves all visible extremities without noticeable abnormality  PSYCH/NEURO: pleasant and cooperative, no obvious depression or anxiety, speech and thought processing grossly intact  ASSESSMENT AND PLAN:  Discussed the following assessment and plan:  ADD (attention deficit disorder) Discontinue adderall xr.  Start adderall 30mg  TID.  F/u in 1 month.      I discussed the assessment and treatment plan with the patient. The patient was provided an opportunity to ask questions and all were answered. The patient agreed with the plan and demonstrated an understanding of the instructions.   The patient was advised to call back or seek an in-person evaluation if the symptoms  worsen or if the condition fails to improve as anticipated.    , DO

## 2022-05-02 NOTE — Assessment & Plan Note (Signed)
Discontinue adderall xr.  Start adderall 30mg  TID.  F/u in 1 month.

## 2022-05-03 ENCOUNTER — Other Ambulatory Visit: Payer: Self-pay

## 2022-05-03 DIAGNOSIS — F411 Generalized anxiety disorder: Secondary | ICD-10-CM

## 2022-05-03 MED ORDER — TADALAFIL 20 MG PO TABS: ORAL_TABLET | ORAL | 5 refills | Status: DC

## 2022-05-03 MED ORDER — BUSPIRONE HCL 30 MG PO TABS: ORAL_TABLET | ORAL | 1 refills | Status: DC

## 2022-05-04 ENCOUNTER — Telehealth: Payer: Self-pay | Admitting: Family Medicine

## 2022-05-04 ENCOUNTER — Other Ambulatory Visit: Payer: Self-pay | Admitting: Family Medicine

## 2022-05-04 MED ORDER — AMPHETAMINE-DEXTROAMPHETAMINE 30 MG PO TABS: 30.0000 mg | ORAL_TABLET | Freq: Three times a day (TID) | ORAL | 0 refills | Status: DC

## 2022-05-04 NOTE — Telephone Encounter (Signed)
Mother came in to state that prescription filled for son (adhd medication) was declined because pharmacist needs PCP to note that medication has changed. Patient's mother also stated that a few other medications she could not specify are soon due for refill. Patient was notified of turn-around time for message response and requested a call when medication is refilled. Katha Hamming

## 2022-05-04 NOTE — Telephone Encounter (Signed)
Called patient and informed her of refills sent. William Willis

## 2022-05-04 NOTE — Telephone Encounter (Signed)
Updated rx sent

## 2022-05-17 ENCOUNTER — Other Ambulatory Visit: Payer: Self-pay | Admitting: Family Medicine

## 2022-05-21 ENCOUNTER — Other Ambulatory Visit: Payer: Self-pay | Admitting: Family Medicine

## 2022-05-21 DIAGNOSIS — M5416 Radiculopathy, lumbar region: Secondary | ICD-10-CM

## 2022-07-18 ENCOUNTER — Other Ambulatory Visit: Payer: Self-pay

## 2022-07-18 NOTE — Progress Notes (Signed)
Needs f/u scheduled before additional refills will be provided.

## 2022-07-20 NOTE — Progress Notes (Unsigned)
Please contact the patient concerning follow-up visit with Dr. Ashley Royalty concerning medication refill. Pt past due for 1 month follow-up after changing medication dosage. Thanks

## 2022-07-21 ENCOUNTER — Encounter: Payer: Self-pay | Admitting: Family Medicine

## 2022-07-21 ENCOUNTER — Telehealth (INDEPENDENT_AMBULATORY_CARE_PROVIDER_SITE_OTHER): Payer: Self-pay | Admitting: Family Medicine

## 2022-07-21 VITALS — Ht 66.0 in | Wt 153.0 lb

## 2022-07-21 DIAGNOSIS — F988 Other specified behavioral and emotional disorders with onset usually occurring in childhood and adolescence: Secondary | ICD-10-CM

## 2022-07-21 DIAGNOSIS — F319 Bipolar disorder, unspecified: Secondary | ICD-10-CM

## 2022-07-21 DIAGNOSIS — M5416 Radiculopathy, lumbar region: Secondary | ICD-10-CM

## 2022-07-21 MED ORDER — PREGABALIN 100 MG PO CAPS: 100.0000 mg | ORAL_CAPSULE | Freq: Two times a day (BID) | ORAL | 3 refills | Status: DC

## 2022-07-21 MED ORDER — AMPHETAMINE-DEXTROAMPHETAMINE 30 MG PO TABS: 30.0000 mg | ORAL_TABLET | Freq: Three times a day (TID) | ORAL | 0 refills | Status: DC

## 2022-07-21 NOTE — Assessment & Plan Note (Signed)
Continue Celexa with Tegretol and BuSpar at current strength.

## 2022-07-21 NOTE — Progress Notes (Signed)
William Willis - 41 y.o. male MRN [id number]  Date of birth: 10-12-1980   This visit type was conducted due to national recommendations for restrictions regarding the COVID-19 Pandemic (e.g. social distancing).  This format is felt to be most appropriate for this patient at this time.  All issues noted in this document were discussed and addressed.  No physical exam was performed (except for noted visual exam findings with Video Visits).  I discussed the limitations of evaluation and management by telemedicine and the availability of in person appointments. The patient expressed understanding and agreed to proceed.  I connected withNAME@ on 07/21/22 at  1:10 PM EST by a video enabled telemedicine application and verified that I am speaking with the correct person using two identifiers.  Present at visit: Everrett Coombe, DO [name]   Patient Location: Work [address] [place] [address]   Provider location:   [facility]  Chief Complaint  Patient presents with   ADHD    HPI  William Willis is a 41 y.o. male who presents via audio/video conferencing for a telehealth visit today.  He is following up for ADHD.  Feels that current strength and dosing of adderall is working well for him at 30mg  TID.  No side effects at current strength.   Continues to have a significant amount of back pain.  He works a fairly physical job which worsens his pain.  He is currently uninsured so has been hesitant to have additional imaging or see a specialist.  He also does not feel that he can take the time off for surgery if this was needed.  He is managing with muscle relaxers in the evening however these make him too drowsy during the day, even Skelaxin.  He does have gabapentin but does not feel like this is effective.  He is taking several Tylenol as well as Goody powders each day.   ROS:  A comprehensive ROS was completed and negative except as noted per HPI  Past Medical History:   Diagnosis Date   ADD (attention deficit disorder)    Anxiety    Aortic insufficiency    Pneumonia    Tobacco abuse     Past Surgical History:  Procedure Laterality Date   ADENOIDECTOMY     SHOULDER SURGERY Right 12-08-11   TONSILLECTOMY     WISDOM TOOTH EXTRACTION  10/19/2017    Family History  Problem Relation Age of Onset   Dementia Maternal Grandmother    Hypertension Maternal Grandfather    Diabetes Maternal Grandfather    Heart attack Maternal Grandfather        x11   Stroke Maternal Grandfather        x2   Prostate cancer Maternal Grandfather    Esophageal cancer Maternal Grandfather     Social History   Socioeconomic History   Marital status: Legally Separated    Spouse name: Not on file   Number of children: 2   Years of education: 12   Highest education level: High school graduate  Occupational History   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  Tobacco Use   Smoking status: Every Day    Packs/day: 2.00    Years: 29.00    Total pack years: 58.00    Types: Cigarettes    Start date: 1993   Smokeless tobacco: Never   Tobacco comments:    currently up to 2-4 packs, average was 2 for many years  Vaping Use   Vaping Use:  Some days  Substance and Sexual Activity   Alcohol use: Yes    Comment: rare   Drug use: Yes    Types: Marijuana    Comment: currently reports daily    Sexual activity: Yes    Partners: Female  Other Topics Concern   Not on file  Social History Narrative   Not on file   Social Determinants of Health   Financial Resource Strain: Not on file  Food Insecurity: Not on file  Transportation Needs: Not on file  Physical Activity: Not on file  Stress: Not on file  Social Connections: Not on file  Intimate Partner Violence: Not on file     Current Outpatient Medications:    albuterol (VENTOLIN HFA) 108 (90 Base) MCG/ACT inhaler, Inhale 1-2 puffs into the lungs every 4 (four) hours as needed for wheezing or shortness of breath.,  Disp: 18 g, Rfl: 1   busPIRone (BUSPAR) 30 MG tablet, Take 1/2 to 1 tablet 2 x /day for Anxiety, Disp: 180 tablet, Rfl: 1   carbamazepine (TEGRETOL) 200 MG tablet, Take 200 mg by mouth 3 (three) times daily., Disp: , Rfl:    citalopram (CELEXA) 20 MG tablet, TAKE 1 TABLET BY MOUTH DAILY. START TAKE 1/2 TABLET BY MOUTH FOR 1 WEEK THEN INCREASE TO A FULL TABLET., Disp: 30 tablet, Rfl: 3   metaxalone (SKELAXIN) 800 MG tablet, Take 1 tablet (800 mg total) by mouth 3 (three) times daily as needed for muscle spasms., Disp: 90 tablet, Rfl: 0   methocarbamol (ROBAXIN) 500 MG tablet, Take 1 tablet (500 mg total) by mouth 2 (two) times daily., Disp: 20 tablet, Rfl: 0   pregabalin (LYRICA) 100 MG capsule, Take 1 capsule (100 mg total) by mouth 2 (two) times daily., Disp: 60 capsule, Rfl: 3   tadalafil (CIALIS) 20 MG tablet, TAKE 1/2 TO 1 TABLET BY MOUTH EVERY OTHER DAY AS NEEDED, Disp: 20 tablet, Rfl: 5   amphetamine-dextroamphetamine (ADDERALL) 30 MG tablet, Take 1 tablet by mouth 3 (three) times daily., Disp: 270 tablet, Rfl: 0  EXAM:  VITALS per patient if applicable: Ht 5\' 6"  (1.676 m)   Wt 153 lb (69.4 kg)   BMI 24.69 kg/m   GENERAL: alert, oriented, appears well and in no acute distress  HEENT: atraumatic, conjunttiva clear, no obvious abnormalities on inspection of external nose and ears  NECK: normal movements of the head and neck  LUNGS: on inspection no signs of respiratory distress, breathing rate appears normal, no obvious gross SOB, gasping or wheezing  CV: no obvious cyanosis  MS: moves all visible extremities without noticeable abnormality  PSYCH/NEURO: pleasant and cooperative, no obvious depression or anxiety, speech and thought processing grossly intact  ASSESSMENT AND PLAN:  Discussed the following assessment and plan:  Right lumbar radiculopathy Continues to have radicular low back pain.  We discussed the amount of Tylenol he is using.  Recommend no more than 3000 mg of  Tylenol each day.  He should not use Goody powders in addition to this as well.  I am going to change his gabapentin to Lyrica to see if this is more effective for him.  Discussed with him that his MRI from 2016 would not be enough to go on given his new or worsening symptoms.  ADD (attention deficit disorder) Doing well with Adderall 30 mg 3 times daily.  We will plan to continue this at current strength.  Bipolar disorder with depression ([place]) Continue Celexa with Tegretol and BuSpar at current strength.  I discussed the assessment and treatment plan with the patient. The patient was provided an opportunity to ask questions and all were answered. The patient agreed with the plan and demonstrated an understanding of the instructions.   The patient was advised to call back or seek an in-person evaluation if the symptoms worsen or if the condition fails to improve as anticipated.    Luetta Nutting, DO

## 2022-07-21 NOTE — Progress Notes (Signed)
Pt notes constant back pain. States he can't afford an MRI or surgery. Taking 15 - 20 Tylenol tabs or 6 packs of Goodies daily. He's a single parent and can't afford to be out of work for a long period of time. Can't afford additional spinal injections.

## 2022-07-21 NOTE — Assessment & Plan Note (Signed)
Continues to have radicular low back pain.  We discussed the amount of Tylenol he is using.  Recommend no more than 3000 mg of Tylenol each day.  He should not use Goody powders in addition to this as well.  I am going to change his gabapentin to Lyrica to see if this is more effective for him.  Discussed with him that his MRI from 2016 would not be enough to go on given his new or worsening symptoms.

## 2022-07-21 NOTE — Assessment & Plan Note (Signed)
Doing well with Adderall 30 mg 3 times daily.  We will plan to continue this at current strength.

## 2022-08-24 ENCOUNTER — Encounter (HOSPITAL_BASED_OUTPATIENT_CLINIC_OR_DEPARTMENT_OTHER): Payer: Self-pay | Admitting: Emergency Medicine

## 2022-08-24 ENCOUNTER — Emergency Department (HOSPITAL_BASED_OUTPATIENT_CLINIC_OR_DEPARTMENT_OTHER)
Admission: EM | Admit: 2022-08-24 | Discharge: 2022-08-25 | Disposition: A | Discharge status: Home / Self Care | Payer: Self-pay | Attending: Emergency Medicine | Admitting: Emergency Medicine

## 2022-08-24 ENCOUNTER — Other Ambulatory Visit: Payer: Self-pay

## 2022-08-24 DIAGNOSIS — M5441 Lumbago with sciatica, right side: Secondary | ICD-10-CM | POA: Insufficient documentation

## 2022-08-24 DIAGNOSIS — M545 Low back pain, unspecified: Secondary | ICD-10-CM

## 2022-08-24 DIAGNOSIS — G8929 Other chronic pain: Secondary | ICD-10-CM | POA: Insufficient documentation

## 2022-08-24 LAB — HEPATIC FUNCTION PANEL
ALT: 22 U/L (ref 0–44)
AST: 25 U/L (ref 15–41)
Albumin: 3.7 g/dL (ref 3.5–5.0)
Alkaline Phosphatase: 66 U/L (ref 38–126)
Bilirubin, Direct: 0.1 mg/dL (ref 0.0–0.2)
Indirect Bilirubin: 0.4 mg/dL (ref 0.3–0.9)
Total Bilirubin: 0.5 mg/dL (ref 0.3–1.2)
Total Protein: 6.7 g/dL (ref 6.5–8.1)

## 2022-08-24 MED ORDER — CYCLOBENZAPRINE HCL 10 MG PO TABS
10.0000 mg | ORAL_TABLET | Freq: Once | ORAL | Status: AC
  Administered 2022-08-24: 10 mg via ORAL
  Filled 2022-08-24: qty 1

## 2022-08-24 MED ORDER — PREDNISONE 20 MG PO TABS: 60.0000 mg | ORAL_TABLET | Freq: Every day | ORAL | 0 refills | Status: DC

## 2022-08-24 MED ORDER — NAPROXEN 250 MG PO TABS
500.0000 mg | ORAL_TABLET | Freq: Once | ORAL | Status: AC
  Administered 2022-08-24: 500 mg via ORAL
  Filled 2022-08-24: qty 2

## 2022-08-24 NOTE — ED Provider Notes (Signed)
[facility] Provider Note   CSN: [id number] Arrival date & time: 08/24/22  1930     History  Chief complaint: Back pain  William Willis is a 42 y.o. male.  The history is provided by the patient.  He has history of attention deficit disorder and chronic back pain for the last 3 years which has been worse over the last 2 months.  Pain is in the lower back and in the right paralumbar area with radiation down the right leg.  He occasionally has some numbness of his right first and second toes and sometimes feels like his leg is going to give out on him but he has not noticed any actual weakness.  Denies any bowel or bladder dysfunction.  He has been taking acetaminophen 325 mg tablets 6 at a time 4 times a day as well as BC powder and ibuprofen for pain.   Home Medications Prior to Admission medications   Medication Sig Start Date End Date Taking? Authorizing Provider  albuterol (VENTOLIN HFA) 108 (90 Base) MCG/ACT inhaler Inhale 1-2 puffs into the lungs every 4 (four) hours as needed for wheezing or shortness of breath. 01/29/20   Liane Comber, NP  amphetamine-dextroamphetamine (ADDERALL) 30 MG tablet Take 1 tablet by mouth 3 (three) times daily. 07/21/22 10/19/22  Luetta Nutting, DO  busPIRone (BUSPAR) 30 MG tablet Take 1/2 to 1 tablet 2 x /day for Anxiety 05/03/22   Luetta Nutting, DO  carbamazepine (TEGRETOL) 200 MG tablet Take 200 mg by mouth 3 (three) times daily.    [provider]  citalopram (CELEXA) 20 MG tablet TAKE 1 TABLET BY MOUTH DAILY. START TAKE 1/2 TABLET BY MOUTH FOR 1 WEEK THEN INCREASE TO A FULL TABLET. 05/17/22   Luetta Nutting, DO  metaxalone (SKELAXIN) 800 MG tablet Take 1 tablet (800 mg total) by mouth 3 (three) times daily as needed for muscle spasms. 06/09/21   Luetta Nutting, DO  methocarbamol (ROBAXIN) 500 MG tablet Take 1 tablet (500 mg total) by mouth 2 (two) times daily. 11/28/21   Tedd Sias, PA  pregabalin (LYRICA)  100 MG capsule Take 1 capsule (100 mg total) by mouth 2 (two) times daily. 07/21/22   Luetta Nutting, DO  tadalafil (CIALIS) 20 MG tablet TAKE 1/2 TO 1 TABLET BY MOUTH EVERY OTHER DAY AS NEEDED 05/03/22   Luetta Nutting, DO      Allergies    Patient has no known allergies.    Review of Systems   Review of Systems  All other systems reviewed and are negative.   Physical Exam Updated Vital Signs BP 123/80 (BP Location: Left Arm)   Pulse 66   Temp 98.9 F (37.2 C)   Resp 18   Ht 5\' 7"  (1.702 m)   Wt 68 kg   SpO2 99%   BMI 23.49 kg/m  Physical Exam Vitals and nursing note reviewed.   43 year old male, resting comfortably and in no acute distress. Vital signs are normal. Oxygen saturation is 99%, which is normal. Head is normocephalic and atraumatic. PERRLA, EOMI. Oropharynx is clear. Neck is nontender and supple without adenopathy or JVD. Back is nontender and there is no CVA tenderness.  There is moderate right paralumbar spasm.  Straight leg raise is positive bilaterally at 30 degrees. Lungs are clear without rales, wheezes, or rhonchi. Chest is nontender. Heart has regular rate and rhythm without murmur. Abdomen is soft, flat, nontender. Extremities have no cyanosis or edema, full range  of motion is present. Skin is warm and dry without rash. Neurologic: Mental status is normal, cranial nerves are intact, there are no sensory deficits.  Strength is 5/5 in all 4 extremities with normal strength of hip flexion, knee flexion, knee extension, dorsiflexion, plantarflexion.  ED Results / Procedures / Treatments   Labs (all labs ordered are listed, but only abnormal results are displayed) Labs Reviewed  HEPATIC FUNCTION PANEL    Procedures Procedures    Medications Ordered in ED Medications - No data to display  ED Course/ Medical Decision Making/ A&P                           Medical Decision Making Amount and/or Complexity of Data Reviewed Labs:  ordered.  Risk Prescription drug management.   And exacerbation of chronic back pain with right-sided sciatica.  No evidence of acute neurologic injury.  I am concerned about the quantity of acetaminophen that he is taking which but, per his history, is 6-8 g a day.  I have ordered a hepatic function panel to make sure that he is not showing any evidence of liver injury.  I am planning on discharging him on a short course of prednisone along with prescription for cyclobenzaprine and I have instructed him to restrict acetaminophen dose to no more than 4 g/day and to supplement with over-the-counter ibuprofen or naproxen.  I am also referring him to sports medicine.  Hepatic function panel is normal, no evidence of elevated transaminases.  I have advised him to limit his consumption of acetaminophen to 4000 mg a day, supplement with ibuprofen or naproxen.  I have also ordered a prescription for prednisone and cyclobenzaprine.  Final Clinical Impression(s) / ED Diagnoses Final diagnoses:  Acute exacerbation of chronic low back pain    Rx / DC Orders ED Discharge Orders          Ordered    cyclobenzaprine (FLEXERIL) 10 MG tablet  3 times daily PRN        08/25/22 0001    predniSONE (DELTASONE) 20 MG tablet  Daily        08/24/22 3419              Delora Fuel, MD 62/22/97 0001
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
## 2022-08-24 NOTE — Discharge Instructions (Signed)
Apply ice for 30 minutes at a time, 4 times a day.  Take ibuprofen or naproxen as needed for pain.  To get additional pain relief, combine this with acetaminophen.  However, make sure that you are total dose of acetaminophen does not exceed 4000 mg in any 1 day.  That would be 12 of the 325 mg tablets or 8 of the 500 mg tablets.  Please be aware that if you choose to use BC powder, that does contain acetaminophen and must be included in the 4000 mg total acetaminophen dose.

## 2022-08-25 MED ORDER — CYCLOBENZAPRINE HCL 10 MG PO TABS: 10.0000 mg | ORAL_TABLET | Freq: Three times a day (TID) | ORAL | 0 refills | Status: DC | PRN

## 2022-08-25 NOTE — ED Notes (Signed)
Patient refused discharge vitals.

## 2022-08-26 ENCOUNTER — Telehealth: Payer: Self-pay | Admitting: General Practice

## 2022-08-26 NOTE — Telephone Encounter (Signed)
Transition Care Management Follow-up Telephone Call Date of discharge and from where: 08/25/22 from [facility] How have you been since you were released from the hospital? Patient said that he is not doing well. He doesn't have insurance and he doesn't know what to do. He is scheduled to see ortho on Tuesday with Dr. Raeford Razor. Scheduled with PCP 09/08/22. Any questions or concerns? No  Items Reviewed: Did the pt receive and understand the discharge instructions provided? Yes  Medications obtained and verified? Yes  Other? No  Any new allergies since your discharge? No  Dietary orders reviewed? Yes Do you have support at home? Yes   Home Care and Equipment/Supplies: Were home health services ordered? no  Functional Questionnaire: (I = Independent and D = Dependent) ADLs: I  Bathing/Dressing- I  Meal Prep- I  Eating- I  Maintaining continence- I  Transferring/Ambulation- I  Managing Meds- I  Follow up appointments reviewed:  PCP Hospital f/u appt confirmed? Yes  Scheduled to see Dr. Zigmund Daniel on 09/08/22 @ 1610. [facility] f/u appt confirmed? No   Are transportation arrangements needed? No  If their condition worsens, is the pt aware to call PCP or go to the Emergency Dept.? Yes Was the patient provided with contact information for the PCP's office or ED? Yes Was to pt encouraged to call back with questions or concerns? Yes

## 2022-08-30 ENCOUNTER — Encounter: Payer: Self-pay | Admitting: Family Medicine

## 2022-08-30 ENCOUNTER — Ambulatory Visit (INDEPENDENT_AMBULATORY_CARE_PROVIDER_SITE_OTHER): Payer: Self-pay | Admitting: Family Medicine

## 2022-08-30 VITALS — BP 112/80 | Ht 67.0 in | Wt 150.0 lb

## 2022-08-30 DIAGNOSIS — M5416 Radiculopathy, lumbar region: Secondary | ICD-10-CM

## 2022-08-30 MED ORDER — METHOCARBAMOL 500 MG PO TABS
500.0000 mg | ORAL_TABLET | Freq: Two times a day (BID) | ORAL | 1 refills | Status: DC | PRN
Start: 2022-08-30 — End: 2022-10-26

## 2022-08-30 MED ORDER — NAPROXEN 500 MG PO TABS: 500.0000 mg | ORAL_TABLET | Freq: Two times a day (BID) | ORAL | 1 refills | Status: DC | PRN

## 2022-08-30 MED ORDER — HYDROCODONE-ACETAMINOPHEN 5-325 MG PO TABS
1.0000 | ORAL_TABLET | Freq: Three times a day (TID) | ORAL | 0 refills | Status: DC | PRN
Start: 2022-08-30 — End: 2022-09-08
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
## 2022-08-30 NOTE — Patient Instructions (Signed)
Nice to meet you Please try heat  Please try the exercises  Please consider the compression  Please use the pain medicine as needed   Please send me a message in MyChart with any questions or updates.  Please see me back in 4 weeks.   --Dr. Raeford Razor

## 2022-08-30 NOTE — Assessment & Plan Note (Signed)
Acute on chronic in nature.  Having severe pain in a radicular pattern down the right lower leg. -Counseled on home exercise therapy and supportive care. -Naproxen. -Initiate Robaxin and stop Flexeril. -Norco as needed. -Could consider further imaging or physical therapy.

## 2022-09-08 ENCOUNTER — Ambulatory Visit: Payer: Self-pay | Admitting: Family Medicine

## 2022-09-08 ENCOUNTER — Other Ambulatory Visit: Payer: Self-pay | Admitting: Family Medicine

## 2022-09-08 MED ORDER — HYDROCODONE-ACETAMINOPHEN 5-325 MG PO TABS: 1.0000 | ORAL_TABLET | Freq: Three times a day (TID) | ORAL | 0 refills | Status: DC | PRN

## 2022-09-08 NOTE — Progress Notes (Signed)
Refilled norco.   Rosemarie Ax, MD [facility] 09/08/2022, 4:35 PM

## 2022-09-22 ENCOUNTER — Other Ambulatory Visit: Payer: Self-pay

## 2022-09-22 DIAGNOSIS — F411 Generalized anxiety disorder: Secondary | ICD-10-CM

## 2022-09-22 MED ORDER — BUSPIRONE HCL 30 MG PO TABS: ORAL_TABLET | ORAL | 1 refills | Status: DC

## 2022-09-22 MED ORDER — CITALOPRAM HYDROBROMIDE 20 MG PO TABS: ORAL_TABLET | ORAL | 3 refills | Status: DC

## 2022-09-30 ENCOUNTER — Ambulatory Visit: Payer: Self-pay | Admitting: Family Medicine

## 2022-10-26 ENCOUNTER — Other Ambulatory Visit: Payer: Self-pay

## 2022-10-26 MED ORDER — AMPHETAMINE-DEXTROAMPHETAMINE 30 MG PO TABS: 30.0000 mg | ORAL_TABLET | Freq: Three times a day (TID) | ORAL | 0 refills | Status: DC

## 2022-10-26 MED ORDER — METHOCARBAMOL 500 MG PO TABS: 500.0000 mg | ORAL_TABLET | Freq: Two times a day (BID) | ORAL | 1 refills | Status: DC | PRN

## 2022-10-26 NOTE — Telephone Encounter (Signed)
Spoke with patient mother requesting  a refill for patient of  Adderall '30mg'$   Last written 07/21/2022 Last OV 07/21/22 ( video visit )  No upcoming appointment scheduled.

## 2022-10-28 ENCOUNTER — Encounter: Payer: Self-pay | Admitting: Family Medicine

## 2022-10-28 ENCOUNTER — Ambulatory Visit: Payer: Self-pay | Admitting: Sports Medicine

## 2022-10-28 ENCOUNTER — Ambulatory Visit (INDEPENDENT_AMBULATORY_CARE_PROVIDER_SITE_OTHER): Payer: Self-pay | Admitting: Family Medicine

## 2022-10-28 VITALS — BP 117/72 | HR 73 | Temp 98.2°F | Ht 67.0 in | Wt 157.8 lb

## 2022-10-28 DIAGNOSIS — R109 Unspecified abdominal pain: Secondary | ICD-10-CM

## 2022-10-28 MED ORDER — HYOSCYAMINE SULFATE 0.125 MG PO TBDP
0.1250 mg | ORAL_TABLET | Freq: Once | ORAL | Status: AC
  Administered 2022-10-28: 0.125 mg via SUBLINGUAL

## 2022-10-28 MED ORDER — ALUM & MAG HYDROXIDE-SIMETH 200-200-20 MG/5ML PO SUSP
30.0000 mL | Freq: Once | ORAL | Status: AC
  Administered 2022-10-28: 30 mL via ORAL

## 2022-10-28 NOTE — Progress Notes (Signed)
Acute Office Visit  Subjective:     Patient ID: William Willis, male    DOB: 1981/06/21, 42 y.o.   MRN: [id number]  Chief Complaint  Patient presents with   Abdominal Pain    Patient in office - c/o  right abominal pain , occasional nausea, loss of appetite x 2 weeks - in review of medication list patient states he takes  about 5 tylenol , 5 advil and 1 goody powder around three times per day for the past 4 years.      HPI Patient is in today for Patient in office - c/o right sided  abominal pain x 1 wk , occasional nausea, loss of appetite x 2 weeks - in review of medication list patient states he takes about 5 tylenol , 5 advil and 1 goody powder around three times per day for the past 4 years.  He denies any blood in the urine or stool.  No fevers or chills.  He has not vomited.  He has been pushing through and trying to work.  His partner who is here with him today says that she tried to get him to go to the ED the last couple nights because of the severity of the pain.  Denies any recent reflux symptoms or GERD.  No bloating or excess gas.  He reports bowels are moving normally no diarrhea or constipation.  He had normal liver function in January.  Normal thyroid function in January as well.  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  He says initially he thought maybe he just pulled a muscle but now he feels like it is different and it is much more intense.  ROS      Objective:    BP 117/72   Pulse 73   Temp 98.2 F (36.8 C)   Ht '5\' 7"'$  (1.702 m)   Wt 157 lb 12 oz (71.6 kg)   SpO2 97%   BMI 24.71 kg/m    Physical Exam Constitutional:      Appearance: He is well-developed.  HENT:     Head: Normocephalic and atraumatic.  Cardiovascular:     Rate and Rhythm: Normal rate and regular rhythm.     Heart sounds: Normal heart sounds.  Pulmonary:     Effort: Pulmonary effort is normal.     Breath sounds: Normal breath sounds.  Abdominal:      Tenderness: There is abdominal tenderness in the right upper quadrant and right lower quadrant. There is no guarding.  Skin:    General: Skin is warm and dry.     Comments: No jaundice  Neurological:     Mental Status: He is alert and oriented to person, place, and time.  Psychiatric:        Behavior: Behavior normal.     No results found for any visits on 10/28/22.      Assessment & Plan:   Problem List Items Addressed This Visit   None Visit Diagnoses     Right sided abdominal pain    -  Primary   Relevant Medications   alum & mag hydroxide-simeth (MAALOX/MYLANTA) 200-200-20 MG/5ML suspension 30 mL (Completed) (Start on 10/28/2022  5:00 PM)   hyoscyamine (ANASPAZ) disintergrating tablet 0.125 mg (Completed) (Start on 10/28/2022  5:00 PM)   Other Relevant Orders   CBC with Differential/Platelet   COMPLETE METABOLIC PANEL WITH GFR   US Abdomen Complete      Right-sided  abdominal pain-we discussed potential causes including acute inflammation of the liver such as hepatitis, gallbladder disease, bowel issues or even gastritis.  He takes an enormous amount of NSAIDs and Tylenol.  He did have his liver examined in January and everything looked good at that time.  Will get a get some labs today to evaluate for acute infection or significant change in liver or renal function.  I would like to get him scheduled for an ultrasound.  Unfortunately he was still drinking drink when he came in today so we were unable to schedule it for this afternoon.  But we were able to get him in tomorrow for scan and we can go from there.  We did give him a GI cocktail minus the lidocaine.  We did not have the lidocaine available today here at our urgent care location but I would like to see if it provides any relief.  Has high NSAID use and Tylenol use can be addressed with his PCP at follow-up.  I will let them know.  Meds ordered this encounter  Medications   alum & mag hydroxide-simeth  (MAALOX/MYLANTA) 200-200-20 MG/5ML suspension 30 mL   hyoscyamine (ANASPAZ) disintergrating tablet 0.125 mg    No follow-ups on file.  Beatrice Lecher, MD

## 2022-10-29 ENCOUNTER — Ambulatory Visit (HOSPITAL_BASED_OUTPATIENT_CLINIC_OR_DEPARTMENT_OTHER)
Admission: RE | Admit: 2022-10-29 | Discharge: 2022-10-29 | Disposition: A | Discharge status: Home / Self Care | Payer: Self-pay | Source: Ambulatory Visit | Attending: Family Medicine | Admitting: Family Medicine

## 2022-10-29 DIAGNOSIS — R109 Unspecified abdominal pain: Secondary | ICD-10-CM | POA: Insufficient documentation

## 2022-10-29 LAB — COMPLETE METABOLIC PANEL WITH GFR
AG Ratio: 1.8 (calc) (ref 1.0–2.5)
ALT: 21 U/L (ref 9–46)
AST: 24 U/L (ref 10–40)
Albumin: 4.2 g/dL (ref 3.6–5.1)
Alkaline phosphatase (APISO): 69 U/L (ref 36–130)
BUN: 19 mg/dL (ref 7–25)
CO2: 29 mmol/L (ref 20–32)
Calcium: 9.4 mg/dL (ref 8.6–10.3)
Chloride: 103 mmol/L (ref 98–110)
Creat: 1.02 mg/dL (ref 0.60–1.29)
Globulin: 2.3 g/dL (calc) (ref 1.9–3.7)
Glucose, Bld: 107 mg/dL — ABNORMAL HIGH (ref 65–99)
Potassium: 4.3 mmol/L (ref 3.5–5.3)
Sodium: 139 mmol/L (ref 135–146)
Total Bilirubin: 0.2 mg/dL (ref 0.2–1.2)
Total Protein: 6.5 g/dL (ref 6.1–8.1)
eGFR: 95 mL/min/{1.73_m2} (ref >=60)

## 2022-10-29 LAB — CBC WITH DIFFERENTIAL/PLATELET
Absolute Monocytes: 617 cells/uL (ref 200–950)
Basophils Absolute: 78 cells/uL (ref 0–200)
Basophils Relative: 0.8 %
Eosinophils Absolute: 108 cells/uL (ref 15–500)
Eosinophils Relative: 1.1 %
HCT: 43.4 % (ref 38.5–50.0)
Hemoglobin: 14.9 g/dL (ref 13.2–17.1)
Lymphs Abs: 2783 cells/uL (ref 850–3900)
MCH: 29.3 pg (ref 27.0–33.0)
MCHC: 34.3 g/dL (ref 32.0–36.0)
MCV: 85.4 fL (ref 80.0–100.0)
MPV: 10.1 fL (ref 7.5–12.5)
Monocytes Relative: 6.3 %
Neutro Abs: 6213 cells/uL (ref 1500–7800)
Neutrophils Relative %: 63.4 %
Platelets: 269 10*3/uL (ref 140–400)
RBC: 5.08 10*6/uL (ref 4.20–5.80)
RDW: 12.2 % (ref 11.0–15.0)
Total Lymphocyte: 28.4 %
WBC: 9.8 10*3/uL (ref 3.8–10.8)

## 2022-10-31 ENCOUNTER — Telehealth: Payer: Self-pay

## 2022-10-31 NOTE — Telephone Encounter (Signed)
I agreed to know such thing.  I do not write his pain medicines.  Looks like these were written for completely unrelated issue to his abdominal pain and I never agreed to give him any medication for his abdominal pain except for the GI cocktail that we provided during his visit.

## 2022-10-31 NOTE — Progress Notes (Signed)
Hi William Willis,  Ultrasound looks very reassuring.  No concerning findings.  Gallbladder looks good no gallstones.  Liver also looks good.  Are you still having a lot of pain or discomfort?

## 2022-10-31 NOTE — Progress Notes (Signed)
Hi Jon, all of your labs are normal and look reassuring.

## 2022-10-31 NOTE — Progress Notes (Signed)
Need to schedule back with PCP if still having pain.

## 2022-10-31 NOTE — Telephone Encounter (Signed)
Ms. William Willis states that Dr. Madilyn Fireman agreed to refill Norco and Lyrica.  Please advise.

## 2022-11-01 ENCOUNTER — Ambulatory Visit: Payer: Self-pay | Admitting: Physician Assistant

## 2022-11-01 NOTE — Telephone Encounter (Signed)
Spoke w/Tiffany and informed her that this is not what Dr. Madilyn Fireman agreed to and that if he would like to get pain meds he would need to f/u with Dr. Zigmund Daniel to discuss.

## 2022-11-02 ENCOUNTER — Ambulatory Visit: Payer: Self-pay | Admitting: Physician Assistant

## 2022-11-03 ENCOUNTER — Ambulatory Visit (INDEPENDENT_AMBULATORY_CARE_PROVIDER_SITE_OTHER): Payer: Self-pay | Admitting: Family Medicine

## 2022-11-03 ENCOUNTER — Encounter: Payer: Self-pay | Admitting: Family Medicine

## 2022-11-03 VITALS — BP 108/67 | HR 77 | Ht 67.0 in | Wt 160.0 lb

## 2022-11-03 DIAGNOSIS — R1013 Epigastric pain: Secondary | ICD-10-CM

## 2022-11-03 DIAGNOSIS — R109 Unspecified abdominal pain: Secondary | ICD-10-CM | POA: Insufficient documentation

## 2022-11-03 DIAGNOSIS — M5416 Radiculopathy, lumbar region: Secondary | ICD-10-CM

## 2022-11-03 DIAGNOSIS — R1011 Right upper quadrant pain: Secondary | ICD-10-CM

## 2022-11-03 MED ORDER — SUCRALFATE 1 G PO TABS: 1.0000 g | ORAL_TABLET | Freq: Three times a day (TID) | ORAL | 0 refills | Status: DC

## 2022-11-03 MED ORDER — PANTOPRAZOLE SODIUM 40 MG PO TBEC: 40.0000 mg | DELAYED_RELEASE_TABLET | Freq: Every day | ORAL | 3 refills | Status: DC

## 2022-11-03 NOTE — Progress Notes (Signed)
William Willis - 42 y.o. male MRN [id number]  Date of birth: May 01, 1981  Subjective Chief Complaint  Patient presents with   Abdominal Pain    HPI COLIN BEHRLE is a is a 42 year old male here today for follow-up of abdominal pain.  He continues to have intermittent episodes of abdominal pain.  Overall pain has improved some.  He was last seen by Dr. Madilyn Fireman last week.  Labs obtained were unremarkable.  Right upper quadrant ultrasound ordered which was also unremarkable.  He does admit to history of significant NSAID use including 5 Tylenol, 5 Advil and 1 Goody powder 3 times per day.  He has been advised against this previously.  He is taking this for back pain.  He does tell me that he was recently seen by a specialist for his back pain in [facility].  He reports that he was prescribed hydrocodone for his pain.  He reports that he does not want to continue to see the spine specialist due to his self-pay status and would like consideration of having these medications prescribed by primary care.  He tells me that hydrocodone seem to improve his mood as well.  ROS:  A comprehensive ROS was completed and negative except as noted per HPI  No Known Allergies  Past Medical History:  Diagnosis Date   ADD (attention deficit disorder)    Anxiety    Aortic insufficiency    Pneumonia    Tobacco abuse     Past Surgical History:  Procedure Laterality Date   ADENOIDECTOMY     SHOULDER SURGERY Right 12-08-11   TONSILLECTOMY     WISDOM TOOTH EXTRACTION  10/19/2017    Social History   Socioeconomic History   Marital status: Legally Separated    Spouse name: Not on file   Number of children: 2   Years of education: 12   Highest education level: High school graduate  Occupational History   Occupation: Works in Museum/gallery curator  Tobacco Use   Smoking status: Every Day    Packs/day: 2.00    Years: 29.00    Additional pack years: 0.00    Total pack years: 58.00    Types: Cigarettes     Start date: 1993   Smokeless tobacco: Never   Tobacco comments:    currently up to 2-4 packs, average was 2 for many years  Vaping Use   Vaping Use: Some days  Substance and Sexual Activity   Alcohol use: Yes    Comment: rare   Drug use: Yes    Types: Marijuana   Sexual activity: Yes    Partners: Female  Other Topics Concern   Not on file  Social History Narrative   Not on file   Social Determinants of Health   Financial Resource Strain: Not on file  Food Insecurity: Not on file  Transportation Needs: Not on file  Physical Activity: Not on file  Stress: Not on file  Social Connections: Not on file    Family History  Problem Relation Age of Onset   Dementia Maternal Grandmother    Hypertension Maternal Grandfather    Diabetes Maternal Grandfather    Heart attack Maternal Grandfather        x11   Stroke Maternal Grandfather        x2   Prostate cancer Maternal Grandfather    Esophageal cancer Maternal Grandfather     Health Maintenance  Topic Date Due   INFLUENZA VACCINE  11/20/2022 (Originally 03/22/2022)  Hepatitis C Screening  01/06/2023 (Originally 03/11/2000)   HIV Screening  01/06/2023 (Originally 03/11/1997)   COVID-19 Vaccine (1) 11/19/2023 (Originally 03/12/1987)   DTaP/Tdap/Td (2 - Td or Tdap) 02/07/2025   HPV VACCINES  Aged Out     ----------------------------------------------------------------------------------------------------------------------------------------------------------------------------------------------------------------- Physical Exam BP 108/67 (BP Location: Left Arm, Patient Position: Sitting, Cuff Size: Normal)   Pulse 77   Ht '5\' 7"'$  (1.702 m)   Wt 160 lb (72.6 kg)   SpO2 97%   BMI 25.06 kg/m   Physical Exam Constitutional:      Appearance: He is well-developed.  HENT:     Head: Normocephalic and atraumatic.  Eyes:     General: No scleral icterus. Cardiovascular:     Rate and Rhythm: Normal rate and regular rhythm.   Pulmonary:     Effort: Pulmonary effort is normal.     Breath sounds: Normal breath sounds.  Abdominal:     Palpations: Abdomen is soft.     Comments: Right upper quadrant epigastric tenderness.  Musculoskeletal:     Cervical back: Neck supple.  Neurological:     Mental Status: He is alert.  Psychiatric:        Mood and Affect: Mood normal.        Behavior: Behavior normal.     ------------------------------------------------------------------------------------------------------------------------------------------------------------------------------------------------------------------- Assessment and Plan  Right lumbar radiculopathy Continues to deal with low back pain. He was seen by Dr. Raeford Razor in January for this.  Reports euphoric mood with hydrocodone and will be concerned about potential for dependence on this.  He is overusing NSAIDs as well and like for him to try to limit this as well.  Abdominal pain Normal labs as well as right upper quadrant ultrasound.  Concern for possible gastritis given his excessive NSAID use.  Adding pantoprazole as well as Carafate.  We may need to have him see GI if symptoms are not improving.   Meds ordered this encounter  Medications   pantoprazole (PROTONIX) 40 MG tablet    Sig: Take 1 tablet (40 mg total) by mouth daily.    Dispense:  30 tablet    Refill:  3   sucralfate (CARAFATE) 1 g tablet    Sig: Take 1 tablet (1 g total) by mouth 4 (four) times daily -  with meals and at bedtime.    Dispense:  40 tablet    Refill:  0    No follow-ups on file.    This visit occurred during the SARS-CoV-2 public health emergency.  Safety protocols were in place, including screening questions prior to the visit, additional usage of staff PPE, and extensive cleaning of exam room while observing appropriate contact time as indicated for disinfecting solutions.

## 2022-11-03 NOTE — Assessment & Plan Note (Signed)
Continues to deal with low back pain. He was seen by Dr. Raeford Razor in January for this.  Reports euphoric mood with hydrocodone and will be concerned about potential for dependence on this.  He is overusing NSAIDs as well and like for him to try to limit this as well.

## 2022-11-03 NOTE — Assessment & Plan Note (Signed)
Normal labs as well as right upper quadrant ultrasound.  Concern for possible gastritis given his excessive NSAID use.  Adding pantoprazole as well as Carafate.  We may need to have him see GI if symptoms are not improving.

## 2022-11-03 NOTE — Patient Instructions (Signed)
Try pantoprazole daily Use carafate with meals  Let me know if not improving with this.

## 2022-11-21 ENCOUNTER — Ambulatory Visit: Payer: Self-pay | Admitting: Family Medicine

## 2022-12-05 ENCOUNTER — Encounter: Payer: Self-pay | Admitting: *Deleted

## 2022-12-26 ENCOUNTER — Other Ambulatory Visit: Payer: Self-pay | Admitting: Family Medicine

## 2023-01-23 ENCOUNTER — Encounter: Payer: Self-pay | Admitting: Family Medicine

## 2023-01-23 ENCOUNTER — Other Ambulatory Visit: Payer: Self-pay | Admitting: Family Medicine

## 2023-01-23 ENCOUNTER — Ambulatory Visit (INDEPENDENT_AMBULATORY_CARE_PROVIDER_SITE_OTHER): Payer: Self-pay | Admitting: Family Medicine

## 2023-01-23 VITALS — BP 124/66 | HR 83 | Wt 160.0 lb

## 2023-01-23 DIAGNOSIS — N529 Male erectile dysfunction, unspecified: Secondary | ICD-10-CM

## 2023-01-23 DIAGNOSIS — F988 Other specified behavioral and emotional disorders with onset usually occurring in childhood and adolescence: Secondary | ICD-10-CM

## 2023-01-23 DIAGNOSIS — M5416 Radiculopathy, lumbar region: Secondary | ICD-10-CM

## 2023-01-23 DIAGNOSIS — G8929 Other chronic pain: Secondary | ICD-10-CM

## 2023-01-23 DIAGNOSIS — F411 Generalized anxiety disorder: Secondary | ICD-10-CM

## 2023-01-23 DIAGNOSIS — M5441 Lumbago with sciatica, right side: Secondary | ICD-10-CM

## 2023-01-23 MED ORDER — AMPHETAMINE-DEXTROAMPHETAMINE 30 MG PO TABS: 30.0000 mg | ORAL_TABLET | Freq: Three times a day (TID) | ORAL | 0 refills | Status: DC

## 2023-01-23 MED ORDER — HYDROCODONE-ACETAMINOPHEN 5-325 MG PO TABS: 1.0000 | ORAL_TABLET | Freq: Four times a day (QID) | ORAL | 0 refills | Status: DC | PRN

## 2023-01-23 MED ORDER — CITALOPRAM HYDROBROMIDE 20 MG PO TABS: ORAL_TABLET | ORAL | 3 refills | Status: DC

## 2023-01-23 MED ORDER — PREGABALIN 100 MG PO CAPS: 100.0000 mg | ORAL_CAPSULE | Freq: Two times a day (BID) | ORAL | 3 refills | Status: DC

## 2023-01-23 MED ORDER — TADALAFIL 20 MG PO TABS: ORAL_TABLET | ORAL | 5 refills | Status: DC

## 2023-01-23 NOTE — Progress Notes (Signed)
William Willis - 42 y.o. male MRN [id number]  Date of birth: 02/17/1981  Subjective Chief Complaint  Patient presents with   Medication Refill    HPI William Willis is a 42 y.o. male here today for follow up.   He is needing refills of medication.  He is doing pretty well with adderall 30mg  TID.  No side effects noted with current medication.    Doing well with citalopram.  Mood stable at current strength.   Remains on lyrica for chronic pain and lumbar radiculopathy.  Has requested norco for pain previously as this seemed to help quite a bit.  Referred to pain management but stopped going due to expense related to being self pain.   ROS:  A comprehensive ROS was completed and negative except as noted per HPI    No Known Allergies  Past Medical History:  Diagnosis Date   ADD (attention deficit disorder)    Anxiety    Aortic insufficiency    Pneumonia    Tobacco abuse     Past Surgical History:  Procedure Laterality Date   ADENOIDECTOMY     SHOULDER SURGERY Right 12-08-11   TONSILLECTOMY     WISDOM TOOTH EXTRACTION  10/19/2017    Social History   Socioeconomic History   Marital status: Legally Separated    Spouse name: Not on file   Number of children: 2   Years of education: 12   Highest education level: High school graduate  Occupational History   Occupation: Works in Psychologist, forensic  Tobacco Use   Smoking status: Every Day    Packs/day: 2.00    Years: 29.00    Additional pack years: 0.00    Total pack years: 58.00    Types: Cigarettes    Start date: 1993   Smokeless tobacco: Never   Tobacco comments:    currently up to 2-4 packs, average was 2 for many years  Vaping Use   Vaping Use: Some days  Substance and Sexual Activity   Alcohol use: Yes    Comment: rare   Drug use: Yes    Types: Marijuana   Sexual activity: Yes    Partners: Female  Other Topics Concern   Not on file  Social History Narrative   Not on file   Social Determinants  of Health   Financial Resource Strain: Not on file  Food Insecurity: Not on file  Transportation Needs: Not on file  Physical Activity: Not on file  Stress: Not on file  Social Connections: Not on file    Family History  Problem Relation Age of Onset   Dementia Maternal Grandmother    Hypertension Maternal Grandfather    Diabetes Maternal Grandfather    Heart attack Maternal Grandfather        x11   Stroke Maternal Grandfather        x2   Prostate cancer Maternal Grandfather    Esophageal cancer Maternal Grandfather     Health Maintenance  Topic Date Due   HIV Screening  Never done   Hepatitis C Screening  Never done   COVID-19 Vaccine (1) 11/19/2023 (Originally 03/12/1987)   INFLUENZA VACCINE  03/23/2023   DTaP/Tdap/Td (2 - Td or Tdap) 02/07/2025   HPV VACCINES  Aged Out     ----------------------------------------------------------------------------------------------------------------------------------------------------------------------------------------------------------------- Physical Exam BP 124/66   Pulse 83   Wt 160 lb (72.6 kg)   SpO2 97%   BMI 25.06 kg/m   Physical Exam Constitutional:  Appearance: Normal appearance.  HENT:     Head: Normocephalic and atraumatic.  Eyes:     General: No scleral icterus. Neurological:     Mental Status: He is alert.  Psychiatric:        Mood and Affect: Mood normal.        Behavior: Behavior normal.     ------------------------------------------------------------------------------------------------------------------------------------------------------------------------------------------------------------------- Assessment and Plan  Right lumbar radiculopathy Continue with lyrica at current strength.  Reinforced that he should limit NSAID and tylenol use, but continues due to poor pain control.  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  Due to pain significantly affecting his quality of life and trying to limit his  NSAID use I did send #15 norco in for pain and referral placed to pain management.    Anxiety state Continue citalopram and buspar at current strength.    ADD (attention deficit disorder) Continue adderall at current strength.  F/u in 6 months.   Erectile dysfunction Tadalafil renewed.     Meds ordered this encounter  Medications   amphetamine-dextroamphetamine (ADDERALL) 30 MG tablet    Sig: Take 1 tablet by mouth 3 (three) times daily.    Dispense:  270 tablet    Refill:  0    Change in dosing.  Stopping extended release.   citalopram (CELEXA) 20 MG tablet    Sig: TAKE 1 TABLET BY MOUTH DAILY.    Dispense:  30 tablet    Refill:  3   pregabalin (LYRICA) 100 MG capsule    Sig: Take 1 capsule (100 mg total) by mouth 2 (two) times daily.    Dispense:  60 capsule    Refill:  3   tadalafil (CIALIS) 20 MG tablet    Sig: TAKE 1/2 TO 1 TABLET BY MOUTH EVERY OTHER DAY AS NEEDED    Dispense:  20 tablet    Refill:  5   HYDROcodone-acetaminophen (NORCO) 5-325 MG tablet    Sig: Take 1 tablet by mouth every 6 (six) hours as needed for moderate pain.    Dispense:  15 tablet    Refill:  0    No follow-ups on file.    This visit occurred during the SARS-CoV-2 public health emergency.  Safety protocols were in place, including screening questions prior to the visit, additional usage of staff PPE, and extensive cleaning of exam room while observing appropriate contact time as indicated for disinfecting solutions.

## 2023-01-23 NOTE — Assessment & Plan Note (Signed)
Continue citalopram and buspar at current strength.

## 2023-01-23 NOTE — Assessment & Plan Note (Addendum)
Continue with lyrica at current strength.  Reinforced that he should limit NSAID and tylenol use, but continues due to poor pain control.  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  Due to pain significantly affecting his quality of life and trying to limit his NSAID use I did send #15 norco in for pain and referral placed to pain management.

## 2023-01-23 NOTE — Assessment & Plan Note (Signed)
Tadalafil renewed.  

## 2023-01-23 NOTE — Assessment & Plan Note (Signed)
Continue adderall at current strength.  F/u in 6 months.

## 2023-02-03 ENCOUNTER — Other Ambulatory Visit: Payer: Self-pay | Admitting: Family Medicine

## 2023-02-03 DIAGNOSIS — M5416 Radiculopathy, lumbar region: Secondary | ICD-10-CM

## 2023-02-13 ENCOUNTER — Other Ambulatory Visit: Payer: Self-pay | Admitting: Family Medicine

## 2023-02-13 DIAGNOSIS — M5416 Radiculopathy, lumbar region: Secondary | ICD-10-CM

## 2023-03-17 ENCOUNTER — Other Ambulatory Visit: Payer: Self-pay | Admitting: Family Medicine

## 2023-03-17 NOTE — Telephone Encounter (Signed)
Requesting rx rf of sucralfate 1G Last written 12/27/2022 Last OV 01/23/2023 Upcoming appt 07/25/2023

## 2023-03-18 ENCOUNTER — Other Ambulatory Visit: Payer: Self-pay | Admitting: Family Medicine

## 2023-03-18 DIAGNOSIS — F411 Generalized anxiety disorder: Secondary | ICD-10-CM

## 2023-04-17 ENCOUNTER — Other Ambulatory Visit: Payer: Self-pay | Admitting: Family Medicine

## 2023-04-17 DIAGNOSIS — M5416 Radiculopathy, lumbar region: Secondary | ICD-10-CM

## 2023-04-20 ENCOUNTER — Other Ambulatory Visit: Payer: Self-pay | Admitting: Family Medicine

## 2023-04-20 ENCOUNTER — Telehealth: Payer: Self-pay | Admitting: Family Medicine

## 2023-04-20 MED ORDER — AMPHETAMINE-DEXTROAMPHETAMINE 30 MG PO TABS: 30.0000 mg | ORAL_TABLET | Freq: Three times a day (TID) | ORAL | 0 refills | Status: DC

## 2023-04-20 NOTE — Telephone Encounter (Signed)
Pt's Mom called requesting a refill for William Willis of his Adderall. [facility] Please contact Kim at [phone].

## 2023-04-21 NOTE — Telephone Encounter (Signed)
Patient mother informed.

## 2023-06-12 ENCOUNTER — Other Ambulatory Visit: Payer: Self-pay | Admitting: Family Medicine

## 2023-06-12 DIAGNOSIS — M5416 Radiculopathy, lumbar region: Secondary | ICD-10-CM

## 2023-06-28 ENCOUNTER — Other Ambulatory Visit: Payer: Self-pay | Admitting: Family Medicine

## 2023-07-25 ENCOUNTER — Ambulatory Visit: Payer: Self-pay | Admitting: Family Medicine

## 2023-07-26 ENCOUNTER — Encounter: Payer: Self-pay | Admitting: Family Medicine

## 2023-07-26 ENCOUNTER — Ambulatory Visit: Payer: Self-pay | Admitting: Family Medicine

## 2023-07-26 ENCOUNTER — Ambulatory Visit (INDEPENDENT_AMBULATORY_CARE_PROVIDER_SITE_OTHER): Payer: Self-pay | Admitting: Family Medicine

## 2023-07-26 VITALS — BP 134/79 | HR 84 | Ht 67.0 in | Wt 165.0 lb

## 2023-07-26 DIAGNOSIS — F902 Attention-deficit hyperactivity disorder, combined type: Secondary | ICD-10-CM

## 2023-07-26 DIAGNOSIS — M5416 Radiculopathy, lumbar region: Secondary | ICD-10-CM

## 2023-07-26 DIAGNOSIS — F319 Bipolar disorder, unspecified: Secondary | ICD-10-CM

## 2023-07-26 DIAGNOSIS — F411 Generalized anxiety disorder: Secondary | ICD-10-CM

## 2023-07-26 MED ORDER — HYDROCODONE-ACETAMINOPHEN 5-325 MG PO TABS: 1.0000 | ORAL_TABLET | Freq: Four times a day (QID) | ORAL | 0 refills | Status: DC | PRN

## 2023-07-26 MED ORDER — CITALOPRAM HYDROBROMIDE 20 MG PO TABS: ORAL_TABLET | ORAL | 3 refills | Status: DC

## 2023-07-26 MED ORDER — PREGABALIN 100 MG PO CAPS: 100.0000 mg | ORAL_CAPSULE | Freq: Two times a day (BID) | ORAL | 3 refills | Status: DC

## 2023-07-26 MED ORDER — TADALAFIL 20 MG PO TABS: ORAL_TABLET | ORAL | 5 refills | Status: DC

## 2023-07-26 MED ORDER — BUSPIRONE HCL 30 MG PO TABS
ORAL_TABLET | ORAL | 1 refills | Status: DC
Start: 2023-07-26 — End: 2024-06-12

## 2023-07-26 NOTE — Progress Notes (Signed)
William Willis - 42 y.o. male MRN [id number]  Date of birth: 1980-11-21  Subjective Chief Complaint  Patient presents with   Medical Management of Chronic Issues    HPI William Willis is a 42 y.o. male here today for follow up visit.   Overall doing pretty well.   Continues to do well with current strength of adderall 30mg  TID.  He is not experiencing any side effects with this at current strength.  Sleeping pretty well.  No  increase in anxiety.    He continues to deal with chronic pian.  He is using OTC analgesics fairly frequently which I have advised him against.  Referral placed to pain management previously, however just scheduled with them as he recently obtained insurance.  He remains on lyria which is helpful.  He is requesting a few additional norco to help until seen by pain management.    Continues on  of tegretol, celexa and buspar tor bipolar d/o. Denies new/worsening symptoms.   ROS:  A comprehensive ROS was completed and negative except as noted per HPI  No Known Allergies  Past Medical History:  Diagnosis Date   ADD (attention deficit disorder)    Anxiety    Aortic insufficiency    Pneumonia    Tobacco abuse     Past Surgical History:  Procedure Laterality Date   ADENOIDECTOMY     SHOULDER SURGERY Right 12-08-11   TONSILLECTOMY     WISDOM TOOTH EXTRACTION  10/19/2017    Social History   Socioeconomic History   Marital status: Legally Separated    Spouse name: Not on file   Number of children: 2   Years of education: 12   Highest education level: High school graduate  Occupational History   Occupation: Works in Psychologist, forensic  Tobacco Use   Smoking status: Every Day    Current packs/day: 2.00    Average packs/day: 2.0 packs/day for 31.9 years (63.9 ttl pk-yrs)    Types: Cigarettes    Start date: 1993   Smokeless tobacco: Never   Tobacco comments:    currently up to 2-4 packs, average was 2 for many years  Vaping Use   Vaping status:  Some Days  Substance and Sexual Activity   Alcohol use: Yes    Comment: rare   Drug use: Yes    Types: Marijuana   Sexual activity: Yes    Partners: Female  Other Topics Concern   Not on file  Social History Narrative   Not on file   Social Determinants of Health   Financial Resource Strain: Not on file  Food Insecurity: Not on file  Transportation Needs: Not on file  Physical Activity: Not on file  Stress: Not on file  Social Connections: Unknown (12/30/2021)   Received from [facility], [facility]   Social Network    Social Network: Not on file    Family History  Problem Relation Age of Onset   Dementia Maternal Grandmother    Hypertension Maternal Grandfather    Diabetes Maternal Grandfather    Heart attack Maternal Grandfather        x11   Stroke Maternal Grandfather        x2   Prostate cancer Maternal Grandfather    Esophageal cancer Maternal Grandfather     Health Maintenance  Topic Date Due   HIV Screening  Never done   Hepatitis C Screening  Never done   COVID-19 Vaccine (1) 11/19/2023 (Originally [birth year]/1987)   INFLUENZA  VACCINE  11/20/2023 (Originally 03/23/2023)   DTaP/Tdap/Td (2 - Td or Tdap) 02/07/2025   HPV VACCINES  Aged Out     ----------------------------------------------------------------------------------------------------------------------------------------------------------------------------------------------------------------- Physical Exam BP 134/79 (BP Location: Left Arm, Patient Position: Sitting, Cuff Size: Normal)   Pulse 84   Ht 5\' 7"  (1.702 m)   Wt 165 lb (74.8 kg)   SpO2 98%   BMI 25.84 kg/m   Physical Exam Constitutional:      Appearance: Normal appearance.  HENT:     Head: Normocephalic and atraumatic.  Cardiovascular:     Rate and Rhythm: Normal rate and regular rhythm.  Pulmonary:     Effort: Pulmonary effort is normal.     Breath sounds: Normal breath sounds.  Neurological:     Mental Status: He is alert.   Psychiatric:        Mood and Affect: Mood normal.        Behavior: Behavior normal.     ------------------------------------------------------------------------------------------------------------------------------------------------------------------------------------------------------------------- Assessment and Plan  Right lumbar radiculopathy Continues to deal with chronic pain.  Continue lyrica at current strength.  Will give one additional short refill of norco but aware that future fills must come from pain management.   Bipolar disorder with depression (HCC) Continue Celexa with Tegretol and BuSpar at current strength. Managed by psychiatry previously but has been without insurance.  Medications renewed as symptoms stable at this time.   ADD (attention deficit disorder) Continue adderall at current strength.  F/u in 6 months.    Meds ordered this encounter  Medications   busPIRone (BUSPAR) 30 MG tablet    Sig: TAKE 1/2 TO 1 TABLET BY MOUTH TWO TIMES A DAY AS NEEDED FOR ANXIETY    Dispense:  180 tablet    Refill:  1   citalopram (CELEXA) 20 MG tablet    Sig: TAKE 1 TABLET BY MOUTH DAILY.    Dispense:  30 tablet    Refill:  3   HYDROcodone-acetaminophen (NORCO) 5-325 MG tablet    Sig: Take 1 tablet by mouth every 6 (six) hours as needed for moderate pain (pain score 4-6).    Dispense:  15 tablet    Refill:  0   pregabalin (LYRICA) 100 MG capsule    Sig: Take 1 capsule (100 mg total) by mouth 2 (two) times daily.    Dispense:  60 capsule    Refill:  3   tadalafil (CIALIS) 20 MG tablet    Sig: TAKE 1/2 TO 1 TABLET BY MOUTH EVERY OTHER DAY AS NEEDED    Dispense:  20 tablet    Refill:  5    Return in about 6 months (around 01/24/2024) for ADHD, chronic pain.    This visit occurred during the SARS-CoV-2 public health emergency.  Safety protocols were in place, including screening questions prior to the visit, additional usage of staff PPE, and extensive cleaning of exam  room while observing appropriate contact time as indicated for disinfecting solutions.

## 2023-07-27 ENCOUNTER — Other Ambulatory Visit: Payer: Self-pay | Admitting: Family Medicine

## 2023-07-27 NOTE — Telephone Encounter (Signed)
Copied from CRM [id number]. Topic: Clinical - Medication Refill >> Jul 27, 2023  4:04 PM Desma Mcgregor wrote: Most Recent Primary Care Visit:  Provider: Everrett Coombe  Department: [facility]  Visit Type: OFFICE VISIT  Date: 07/26/2023  Medication: ***  Has the patient contacted their pharmacy?  (Agent: If no, request that the patient contact the pharmacy for the refill. If patient does not wish to contact the pharmacy document the reason why and proceed with request.) (Agent: If yes, when and what did the pharmacy advise?)  Is this the correct pharmacy for this prescription?  If no, delete pharmacy and type the correct one.  This is the patient's preferred pharmacy:  [facility] [id number] - [place], [place] - [address] [place] [address] Phone: [phone] Fax: [phone]  [facility][id number] - [place], [place] - [address] AT [facility] [address] [place] [address] Phone: [phone] Fax: [phone]   Has the prescription been filled recently?   Is the patient out of the medication?   Has the patient been seen for an appointment in the last year OR does the patient have an upcoming appointment?   Can we respond through MyChart?   Agent: Please be advised that Rx refills may take up to 3 business days. We ask that you follow-up with your pharmacy.

## 2023-07-28 MED ORDER — AMPHETAMINE-DEXTROAMPHETAMINE 30 MG PO TABS: 30.0000 mg | ORAL_TABLET | Freq: Three times a day (TID) | ORAL | 0 refills | Status: DC

## 2023-07-30 NOTE — Assessment & Plan Note (Addendum)
Continue Celexa with Tegretol and BuSpar at current strength. Managed by psychiatry previously but has been without insurance.  Medications renewed as symptoms stable at this time.

## 2023-07-30 NOTE — Assessment & Plan Note (Signed)
Continues to deal with chronic pain.  Continue lyrica at current strength.  Will give one additional short refill of norco but aware that future fills must come from pain management.

## 2023-07-30 NOTE — Assessment & Plan Note (Signed)
Continue adderall at current strength.  F/u in 6 months.

## 2023-08-21 ENCOUNTER — Telehealth: Payer: Self-pay

## 2023-08-21 DIAGNOSIS — M5416 Radiculopathy, lumbar region: Secondary | ICD-10-CM

## 2023-08-21 NOTE — Telephone Encounter (Signed)
Copied from CRM [id number]. Topic: Appointments - Scheduling Inquiry for Clinic >> Aug 21, 2023  3:17 PM Ivette P wrote: Reason for CRM: Patient is requesting to be scheduled for MRI. Call back [phone]  Spoke with pts wife and she states a lady from our office told them to call our office and have MRI done on a Saturday, advised her we don't schedule those, also didn't know what pt wants an MRI for. Explained it has to be approved by insurance first. I didn't see a referral for one. Pls advise Roselyn Reef, CMA

## 2023-08-30 NOTE — Addendum Note (Signed)
 Addended by: Mammie Lorenzo on: 08/30/2023 04:30 PM   Modules accepted: Orders

## 2023-09-01 ENCOUNTER — Telehealth: Payer: Self-pay | Admitting: Family Medicine

## 2023-09-01 NOTE — Telephone Encounter (Signed)
 Copied from CRM [id number]. Topic: Referral - Request for Referral >> Aug 31, 2023  9:53 AM Joesph PARAS wrote: Did the patient discuss referral with their provider in the last year? Yes (If No - schedule appointment) (If Yes - send message)  Appointment offered? No  Type of order/referral and detailed reason for visit: Pain Management - States referral must be resent  Preference of office, provider, location: Same one referred to last time  If referral order, have you been seen by this specialty before? Yes (If Yes, this issue or another issue? When? Where?  Can we respond through MyChart? No - Phone Call Preferred

## 2023-09-01 NOTE — Telephone Encounter (Signed)
 Referral, clinical notes, imaging report, demographics and copies of insurance cards have been faxed to [facility] at [phone]. Office will contact patient to schedule referral appointment.    **Pain Management Referrals can take up to 2 weeks to process/Complete. Referring provider normally reviews patients chart prior to accepting or declining pain referral. Scheduling Coordinator will contact patient to follow-up regarding scheduling of appointment.

## 2023-09-05 ENCOUNTER — Other Ambulatory Visit: Payer: Self-pay | Admitting: Family Medicine

## 2023-09-05 DIAGNOSIS — Z0189 Encounter for other specified special examinations: Secondary | ICD-10-CM

## 2023-09-10 ENCOUNTER — Ambulatory Visit: Payer: 59

## 2023-09-10 DIAGNOSIS — Z0189 Encounter for other specified special examinations: Secondary | ICD-10-CM | POA: Diagnosis not present

## 2023-09-10 DIAGNOSIS — M5416 Radiculopathy, lumbar region: Secondary | ICD-10-CM

## 2023-09-10 DIAGNOSIS — M5127 Other intervertebral disc displacement, lumbosacral region: Secondary | ICD-10-CM | POA: Diagnosis not present

## 2023-09-29 ENCOUNTER — Encounter: Payer: Self-pay | Admitting: Family Medicine

## 2023-10-12 ENCOUNTER — Telehealth: Payer: Self-pay

## 2023-10-12 NOTE — Telephone Encounter (Signed)
 Attempted call to patient. Left a voice mail message requesting a return call.

## 2023-10-12 NOTE — Telephone Encounter (Signed)
Copied from CRM [id number]. Topic: Clinical - Lab/Test Results >> Oct 09, 2023  2:06 PM Suzette B wrote: Reason for CRM: Patient's representative Elmarie Shiley Coro called in regards to an MRI that was done in the recent pass read off the results to the patient, patient stated he was concerned and wanting to speak with someone because the pain is getting worse please call patient at [phone]

## 2023-10-13 NOTE — Telephone Encounter (Signed)
Spoke with patient.  Relayed lumbar MRI results as given by DR. Matthews in chart.  He wanted to ask Dr. Ashley Royalty "How it can be mostly normal for his age as results  when last MRI In 2018  shown that he possibly needed surgery for results a that time ?" He states he is now in constant pain and has been out of work due to this pain more in the last year.  Wanted to know Dr. Ashley Royalty thoughts on this.

## 2023-10-13 NOTE — Telephone Encounter (Signed)
 Again attempted call to patient. Left a voice mail message requesting a return call.

## 2023-10-13 NOTE — Telephone Encounter (Signed)
Patient informed. He declines referral to  spine center at this time.

## 2023-10-15 ENCOUNTER — Other Ambulatory Visit: Payer: Self-pay | Admitting: Family Medicine

## 2023-10-15 DIAGNOSIS — M5416 Radiculopathy, lumbar region: Secondary | ICD-10-CM

## 2023-10-31 ENCOUNTER — Other Ambulatory Visit: Payer: Self-pay | Admitting: Family Medicine

## 2023-10-31 NOTE — Telephone Encounter (Unsigned)
 Copied from CRM [id number]. Topic: Clinical - Medication Refill >> Oct 31, 2023 11:43 AM Nila Nephew wrote: Most Recent Primary Care Visit:  Provider: Everrett Coombe  Department: [facility]  Visit Type: OFFICE VISIT  Date: 07/26/2023  Medication: amphetamine-dextroamphetamine (ADDERALL) 30 MG tablet (Expired)  Has the patient contacted their pharmacy? Yes (Agent: If no, request that the patient contact the pharmacy for the refill. If patient does not wish to contact the pharmacy document the reason why and proceed with request.) (Agent: If yes, when and what did the pharmacy advise?)  Is this the correct pharmacy for this prescription? Yes If no, delete pharmacy and type the correct one.  This is the patient's preferred pharmacy:  [facility] [id number] [place], [place] - [address] [place] [address] Phone: [phone] Fax: [phone]  Has the prescription been filled recently? No  Is the patient out of the medication? Yes  Has the patient been seen for an appointment in the last year OR does the patient have an upcoming appointment? Yes  Can we respond through MyChart? No  Agent: Please be advised that Rx refills may take up to 3 business days. We ask that you follow-up with your pharmacy.

## 2023-11-01 MED ORDER — AMPHETAMINE-DEXTROAMPHETAMINE 30 MG PO TABS: 30.0000 mg | ORAL_TABLET | Freq: Three times a day (TID) | ORAL | 0 refills | Status: DC

## 2023-12-05 ENCOUNTER — Other Ambulatory Visit: Payer: Self-pay | Admitting: Family Medicine

## 2023-12-06 NOTE — Telephone Encounter (Signed)
 Last OV: 07/26/23 Next OV: 02/02/24 Last RF: 11/01/23

## 2023-12-07 MED ORDER — AMPHETAMINE-DEXTROAMPHETAMINE 30 MG PO TABS: 30.0000 mg | ORAL_TABLET | Freq: Three times a day (TID) | ORAL | 0 refills | Status: DC

## 2023-12-07 NOTE — Telephone Encounter (Signed)
 Last OV: 07/26/23 Next OV: 02/02/24 Last RF: 11/01/23

## 2023-12-07 NOTE — Telephone Encounter (Signed)
 Lupie Salle, CMA    Pt William Willis calling on the status of his medication: amphetamine-dextroamphetamine (ADDERALL) 30 MG tablet  He is also questioning why he was only given 90 tabs when he is taking 3 a day, should be 270 tabs.    Patient would like a call, he stated he has never had an issue with getting his meds until recently

## 2024-01-08 ENCOUNTER — Other Ambulatory Visit: Payer: Self-pay | Admitting: Family Medicine

## 2024-01-08 NOTE — Telephone Encounter (Signed)
 Copied from CRM [id number]. Topic: Clinical - Medication Refill >> Jan 08, 2024 11:55 AM Lenon Radar A wrote: Medication: amphetamine -dextroamphetamine  (ADDERALL) 30 MG tablet   Has the patient contacted their pharmacy? Yes (Agent: If no, request that the patient contact the pharmacy for the refill. If patient does not wish to contact the pharmacy document the reason why and proceed with request.) (Agent: If yes, when and what did the pharmacy advise?)  Patient has no refills left. Patient was advised of refill process and timeframe.   This is the patient's preferred pharmacy:  [facility] [id number] [place], [place] - [address] [place] [address] Phone: [phone] Fax: [phone]   Is this the correct pharmacy for this prescription? Yes If no, delete pharmacy and type the correct one.   Has the prescription been filled recently? No  Is the patient out of the medication? No  Has the patient been seen for an appointment in the last year OR does the patient have an upcoming appointment? Yes  Can we respond through MyChart? Yes  Agent: Please be advised that Rx refills may take up to 3 business days. We ask that you follow-up with your pharmacy.

## 2024-01-09 MED ORDER — AMPHETAMINE-DEXTROAMPHETAMINE 30 MG PO TABS: 30.0000 mg | ORAL_TABLET | Freq: Three times a day (TID) | ORAL | 0 refills | Status: DC

## 2024-01-10 ENCOUNTER — Telehealth: Payer: Self-pay

## 2024-01-10 NOTE — Telephone Encounter (Signed)
 Copied from CRM [id number]. Topic: Clinical - Medication Refill >> Jan 08, 2024 11:55 AM Lenon Radar A wrote: Medication: amphetamine -dextroamphetamine  (ADDERALL) 30 MG tablet   Has the patient contacted their pharmacy? Yes (Agent: If no, request that the patient contact the pharmacy for the refill. If patient does not wish to contact the pharmacy document the reason why and proceed with request.) (Agent: If yes, when and what did the pharmacy advise?)  Patient has no refills left. Patient was advised of refill process and timeframe.   This is the patient's preferred pharmacy:  [facility] [id number] [place], [place] - [address] [place] [address] Phone: [phone] Fax: [phone]   Is this the correct pharmacy for this prescription? Yes If no, delete pharmacy and type the correct one.   Has the prescription been filled recently? No  Is the patient out of the medication? No  Has the patient been seen for an appointment in the last year OR does the patient have an upcoming appointment? Yes  Can we respond through MyChart? Yes  Agent: Please be advised that Rx refills may take up to 3 business days. We ask that you follow-up with your pharmacy. >> Jan 09, 2024  4:18 PM Tisa Forester wrote: Severiano Danish calling on patient behalf checking on the status of the refill

## 2024-01-10 NOTE — Telephone Encounter (Signed)
 This request has been handled. No further action is required. Rx sent to the pharmacy on 01/09/24.

## 2024-01-24 ENCOUNTER — Ambulatory Visit: Payer: Self-pay | Admitting: Family Medicine

## 2024-02-02 ENCOUNTER — Ambulatory Visit: Payer: Self-pay | Admitting: Family Medicine

## 2024-02-09 ENCOUNTER — Other Ambulatory Visit: Payer: Self-pay | Admitting: Family Medicine

## 2024-02-09 NOTE — Telephone Encounter (Signed)
 Pt will need an appointment to obtain refills on Adderall. Thanks.

## 2024-02-09 NOTE — Telephone Encounter (Signed)
 Copied from CRM [id number]. Topic: Clinical - Medication Refill >> Feb 09, 2024 12:24 PM Adrianna P wrote: Medication: amphetamine -dextroamphetamine  (ADDERALL) 30 MG tablet  Has the patient contacted their pharmacy? Yes (Agent: If no, request that the patient contact the pharmacy for the refill. If patient does not wish to contact the pharmacy document the reason why and proceed with request.) (Agent: If yes, when and what did the pharmacy advise?)  This is the patient's preferred pharmacy:  [facility] [id number] - [place], [place] - [address] [place] [address] Phone: [phone] Fax: [phone]  [facility] #[id number] - [place], [place] - [address] AT [facility] [address]  [place] [address] Phone: [phone] Fax: [phone]  Is this the correct pharmacy for this prescription? Yes If no, delete pharmacy and type the correct one.   Has the prescription been filled recently? No  Is the patient out of the medication? No  Has the patient been seen for an appointment in the last year OR does the patient have an upcoming appointment? Yes  Can we respond through MyChart? No  Agent: Please be advised that Rx refills may take up to 3 business days. We ask that you follow-up with your pharmacy.

## 2024-02-09 NOTE — Telephone Encounter (Signed)
Pt has been scheduled 7/1.

## 2024-02-13 ENCOUNTER — Telehealth (INDEPENDENT_AMBULATORY_CARE_PROVIDER_SITE_OTHER): Payer: Self-pay | Admitting: Family Medicine

## 2024-02-13 ENCOUNTER — Encounter: Payer: Self-pay | Admitting: Family Medicine

## 2024-02-13 VITALS — Ht 67.0 in | Wt 165.0 lb

## 2024-02-13 DIAGNOSIS — F902 Attention-deficit hyperactivity disorder, combined type: Secondary | ICD-10-CM

## 2024-02-13 MED ORDER — AMPHETAMINE-DEXTROAMPHETAMINE 30 MG PO TABS: 30.0000 mg | ORAL_TABLET | Freq: Three times a day (TID) | ORAL | 0 refills | Status: DC

## 2024-02-13 NOTE — Progress Notes (Signed)
 William Willis - 43 y.o. male MRN [id number]  Date of birth: 1981/04/01   All issues noted in this document were discussed and addressed.  No physical exam was performed (except for noted visual exam findings with Video Visits).  I discussed the limitations of evaluation and management by telemedicine and the availability of in person appointments. The patient expressed understanding and agreed to proceed.  I connected withNAME@ on 02/13/24 at  8:50 AM EDT by a video enabled telemedicine application and verified that I am speaking with the correct person using two identifiers.  Present at visit: Velma Ku, DO Dorn KATHEE Bars   Patient Location: Home [address] [place] [address]   Provider location:   [facility]  Chief Complaint  Patient presents with   Medication Refill    HPI  William Willis is a 43 y.o. male who presents via audio/video conferencing for a telehealth visit today.  Following up for ADHD.  He continues on adderall 30mg  TID.  Doing well with this at current strength.  He denies side effects at current strength.   ROS:  A comprehensive ROS was completed and negative except as noted per HPI  Past Medical History:  Diagnosis Date   ADD (attention deficit disorder)    Anxiety    Aortic insufficiency    Pneumonia    Tobacco abuse     Past Surgical History:  Procedure Laterality Date   ADENOIDECTOMY     SHOULDER SURGERY Right 12-08-11   TONSILLECTOMY     WISDOM TOOTH EXTRACTION  10/19/2017    Family History  Problem Relation Age of Onset   Dementia Maternal Grandmother    Hypertension Maternal Grandfather    Diabetes Maternal Grandfather    Heart attack Maternal Grandfather        x11   Stroke Maternal Grandfather        x2   Prostate cancer Maternal Grandfather    Esophageal cancer Maternal Grandfather     Social History   Socioeconomic History   Marital status: Legally Separated    Spouse name: Not on file   Number of  children: 2   Years of education: 12   Highest education level: High school graduate  Occupational History   Occupation: Works in Psychologist, forensic  Tobacco Use   Smoking status: Every Day    Current packs/day: 2.00    Average packs/day: 2.0 packs/day for 32.5 years (65.0 ttl pk-yrs)    Types: Cigarettes    Start date: 1993   Smokeless tobacco: Never   Tobacco comments:    currently up to 2-4 packs, average was 2 for many years  Vaping Use   Vaping status: Some Days  Substance and Sexual Activity   Alcohol use: Yes    Comment: rare   Drug use: Yes    Types: Marijuana   Sexual activity: Yes    Partners: Female  Other Topics Concern   Not on file  Social History Narrative   Not on file   Social Drivers of Health   Financial Resource Strain: Not on file  Food Insecurity: Not on file  Transportation Needs: Not on file  Physical Activity: Not on file  Stress: Not on file  Social Connections: Unknown (12/30/2021)   [facility]   Social Network    Social Network: Not on file  Intimate Partner Violence: Unknown (11/25/2021)   Received from [facility]   HITS    Physically Hurt: Not on file  Insult or Talk Down To: Not on file    Threaten Physical Harm: Not on file    Scream or Curse: Not on file     Current Outpatient Medications:    amphetamine -dextroamphetamine  (ADDERALL) 30 MG tablet, Take 1 tablet by mouth 3 (three) times daily., Disp: 90 tablet, Rfl: 0   busPIRone  (BUSPAR ) 30 MG tablet, TAKE 1/2 TO 1 TABLET BY MOUTH TWO TIMES A DAY AS NEEDED FOR ANXIETY, Disp: 180 tablet, Rfl: 1   citalopram  (CELEXA ) 20 MG tablet, TAKE 1 TABLET BY MOUTH DAILY., Disp: 30 tablet, Rfl: 3   gabapentin  (NEURONTIN ) 800 MG tablet, TAKE 1 TABLET BY MOUTH EVERY NIGHT AT BEDTIME **MAY TAKE UP TO THREE TIMES A DAY. DO NOT EXCEED 4 TABLETS TOTAL DAILY**, Disp: 120 tablet, Rfl: 1   pantoprazole  (PROTONIX ) 40 MG tablet, Take 1 tablet (40 mg total) by mouth daily., Disp: 30 tablet,  Rfl: 3   pregabalin  (LYRICA ) 100 MG capsule, Take 1 capsule (100 mg total) by mouth 2 (two) times daily., Disp: 60 capsule, Rfl: 3   tadalafil  (CIALIS ) 20 MG tablet, TAKE 1/2 TO 1 TABLET BY MOUTH EVERY OTHER DAY AS NEEDED, Disp: 20 tablet, Rfl: 5  EXAM:  VITALS per patient if applicable: Ht 5' 7 (1.702 m)   Wt 165 lb (74.8 kg)   BMI 25.84 kg/m   GENERAL: alert, oriented, appears well and in no acute distress  HEENT: atraumatic, conjunttiva clear, no obvious abnormalities on inspection of external nose and ears  NECK: normal movements of the head and neck  LUNGS: on inspection no signs of respiratory distress, breathing rate appears normal, no obvious gross SOB, gasping or wheezing  CV: no obvious cyanosis  MS: moves all visible extremities without noticeable abnormality  PSYCH/NEURO: pleasant and cooperative, no obvious depression or anxiety, speech and thought processing grossly intact  ASSESSMENT AND PLAN:  Discussed the following assessment and plan:  ADD (attention deficit disorder) He is doing well with adderall 30mg  tid.  Will plan to continue at current strength.  PDMP reviewed. F/u in 6 months.      I discussed the assessment and treatment plan with the patient. The patient was provided an opportunity to ask questions and all were answered. The patient agreed with the plan and demonstrated an understanding of the instructions.   The patient was advised to call back or seek an in-person evaluation if the symptoms worsen or if the condition fails to improve as anticipated.    Velma Ku, DO

## 2024-02-13 NOTE — Assessment & Plan Note (Signed)
 He is doing well with adderall 30mg  tid.  Will plan to continue at current strength.  PDMP reviewed. F/u in 6 months.

## 2024-02-20 ENCOUNTER — Telehealth: Admitting: Family Medicine

## 2024-03-06 ENCOUNTER — Other Ambulatory Visit: Payer: Self-pay | Admitting: Family Medicine

## 2024-03-13 ENCOUNTER — Other Ambulatory Visit: Payer: Self-pay | Admitting: Family Medicine

## 2024-03-13 NOTE — Telephone Encounter (Unsigned)
 Copied from CRM [id number]. Topic: Clinical - Medication Refill >> Mar 13, 2024  1:54 PM Suzette B wrote: Medication:  amphetamine -dextroamphetamine  (ADDERALL) 30 MG tablet  Has the patient contacted their pharmacy? No Patient's spouse: Ms. Tiffany Coro-Frontera stated that they didn't call because each time they call they let them know the provider's office needs to be called   This is the patient's preferred pharmacy:  [facility] [id number] [place], [place] - [address] [place] [address] Phone: [phone] Fax: [phone]    Is this the correct pharmacy for this prescription? Yes If no, delete pharmacy and type the correct one.   Has the prescription been filled recently? Yes  Is the patient out of the medication? No 2 left   Has the patient been seen for an appointment in the last year OR does the patient have an upcoming appointment? Yes  Can we respond through MyChart? Yes  Agent: Please be advised that Rx refills may take up to 3 business days. We ask that you follow-up with your pharmacy.

## 2024-03-15 MED ORDER — AMPHETAMINE-DEXTROAMPHETAMINE 30 MG PO TABS: 30.0000 mg | ORAL_TABLET | Freq: Three times a day (TID) | ORAL | 0 refills | Status: DC

## 2024-04-12 ENCOUNTER — Other Ambulatory Visit: Payer: Self-pay | Admitting: Family Medicine

## 2024-04-12 MED ORDER — AMPHETAMINE-DEXTROAMPHETAMINE 30 MG PO TABS: 30.0000 mg | ORAL_TABLET | Freq: Three times a day (TID) | ORAL | 0 refills | Status: DC

## 2024-04-12 NOTE — Telephone Encounter (Signed)
 Copied from CRM #[id number]. Topic: Clinical - Medication Refill >> Apr 12, 2024 10:40 AM Miquel SAILOR wrote: Medication: amphetamine -dextroamphetamine  (ADDERALL) 30 MG tablet   Has the patient contacted their pharmacy? Yes (Agent: If no, request that the patient contact the pharmacy for the refill. If patient does not wish to contact the pharmacy document the reason why and proceed with request.) (Agent: If yes, when and what did the pharmacy advise?)  This is the patient's preferred pharmacy:  [facility] [id number] [place], [place] - [address] [place] [address] Phone: [phone] Fax: [phone]    Is this the correct pharmacy for this prescription? Yes If no, delete pharmacy and type the correct one.   Has the prescription been filled recently? Yes  Is the patient out of the medication? Yes  Has the patient been seen for an appointment in the last year OR does the patient have an upcoming appointment? Yes  Can we respond through MyChart? Yes  Agent: Please be advised that Rx refills may take up to 3 business days. We ask that you follow-up with your pharmacy.

## 2024-04-23 ENCOUNTER — Encounter: Payer: Self-pay | Admitting: Sports Medicine

## 2024-05-15 ENCOUNTER — Other Ambulatory Visit: Payer: Self-pay | Admitting: Family Medicine

## 2024-05-15 NOTE — Telephone Encounter (Unsigned)
 Copied from CRM #[id number]. Topic: Clinical - Medication Refill >> May 15, 2024  1:34 PM Selinda RAMAN wrote: Medication: amphetamine -dextroamphetamine  (ADDERALL) 30 MG tablet  Has the patient contacted their pharmacy? No   This is the patient's preferred pharmacy:  [facility] [id number] - [place], [place] - [address] [place] [address] Phone: [phone] Fax: [phone]  [facility] #[id number] - [place], [place] - [address] AT [facility] [address] [place] [address] Phone: [phone] Fax: [phone]  Is this the correct pharmacy for this prescription? Yes If no, delete pharmacy and type the correct one.   Has the prescription been filled recently? No  Is the patient out of the medication? No but he only has a few pills left  Has the patient been seen for an appointment in the last year OR does the patient have an upcoming appointment? Yes  Can we respond through MyChart? Yes  Please assist patient further

## 2024-05-17 ENCOUNTER — Other Ambulatory Visit: Payer: Self-pay | Admitting: Family Medicine

## 2024-05-17 MED ORDER — AMPHETAMINE-DEXTROAMPHETAMINE 30 MG PO TABS: 30.0000 mg | ORAL_TABLET | Freq: Three times a day (TID) | ORAL | 0 refills | Status: DC

## 2024-05-17 NOTE — Telephone Encounter (Signed)
 Copied from CRM [id number]. Topic: Clinical - Medication Refill >> May 17, 2024 12:00 PM Antwanette L wrote: Medication:  tadalafil  (CIALIS ) 20 MG table   Has the patient contacted their pharmacy? Yes   This is the patient's preferred pharmacy:  [facility] [id number] [place], [place] - [address] [place] [address] Phone: [phone] Fax: [phone]   Is this the correct pharmacy for this prescription? Yes If no, delete pharmacy and type the correct one.   Has the prescription been filled recently? No. Last refill was on 07/26/23  Is the patient out of the medication? Yes  Has the patient been seen for an appointment in the last year OR does the patient have an upcoming appointment? Yes. Last ov(virtual) w/ Dr. Alvia was on 02/13/24  Can we respond through MyChart? No. Contact the patient by phone at [phone]  Agent: Please be advised that Rx refills may take up to 3 business days. We ask that you follow-up with your pharmacy.

## 2024-06-12 ENCOUNTER — Other Ambulatory Visit: Payer: Self-pay | Admitting: Family Medicine

## 2024-06-12 DIAGNOSIS — F411 Generalized anxiety disorder: Secondary | ICD-10-CM

## 2024-06-17 ENCOUNTER — Ambulatory Visit: Payer: Self-pay

## 2024-06-17 ENCOUNTER — Other Ambulatory Visit: Payer: Self-pay | Admitting: Family Medicine

## 2024-06-17 NOTE — Telephone Encounter (Unsigned)
 Copied from CRM [id number]. Topic: Clinical - Medication Refill >> Jun 17, 2024 11:39 AM Kendralyn S wrote: Medication: amphetamine -dextroamphetamine  (ADDERALL) 30 MG tablet   Has the patient contacted their pharmacy? Yes (Agent: If no, request that the patient contact the pharmacy for the refill. If patient does not wish to contact the pharmacy document the reason why and proceed with request.) (Agent: If yes, when and what did the pharmacy advise?)  This is the patient's preferred pharmacy:  [facility] #[id number] - [place],  - [address] AT [facility] OF [address] [place] [address] Phone: [phone] Fax: [phone] Hours: Not open 24 hours    Is this the correct pharmacy for this prescription? Yes If no, delete pharmacy and type the correct one.   Has the prescription been filled recently? No  Is the patient out of the medication? Yes  Has the patient been seen for an appointment in the last year OR does the patient have an upcoming appointment? Yes  Can we respond through MyChart? no  Agent: Please be advised that Rx refills may take up to 3 business days. We ask that you follow-up with your pharmacy.

## 2024-06-17 NOTE — Telephone Encounter (Signed)
 This RN attempted to reach patient to discuss symptoms further. VM not set up, unable to [facility]  Message from Pease V sent at 06/17/2024  3:40 PM EDT  Summary: Migraines and irritablility- out of medications   Reason for Triage: Patient wife reported that patient is out of Adderall and having severe migraines and extreme irritability. He is currently out of state for work and she stated a call to him would be best to discuss symptoms since she is getting reports only occasionally. His callback is [phone]

## 2024-06-17 NOTE — Telephone Encounter (Signed)
 FYI Only or Action Required?: Action required by provider: Needs his Adderall signed off on .  Patient was last seen in primary care on 02/13/2024 by Alvia Bring, DO.  Called Nurse Triage reporting No chief complaint on file..  Symptoms began yesterday.  Interventions attempted: Nothing.  Symptoms are: stable.  Triage Disposition: No disposition on file.  Patient/caregiver understands and will follow disposition?:   Answer Assessment - Initial Assessment Questions 1. REASON FOR CALL: What is the main reason for your call? or How can I best help you?    Adderall needing to  needing to be refilled. Out for 2 days. Having headaches and irritability consistent with each time he has run out of meds previously. Patient requesting meds sent to pharmacy in [facility]. Confirmed. ED/UC precautions given and understood.  Protocols used: Information Only Call - No Triage-A-AH

## 2024-06-18 ENCOUNTER — Telehealth: Payer: Self-pay | Admitting: Family Medicine

## 2024-06-18 NOTE — Telephone Encounter (Signed)
 Requesting rx rf of  Adderall 30mg  tablet Last written 05/17/2024 Last OV - video visit  02/13/2024 Upcoming appt = none

## 2024-06-18 NOTE — Telephone Encounter (Signed)
 Prescription refill pended in separate message with same request - duplicate message

## 2024-06-18 NOTE — Telephone Encounter (Signed)
 Copied from CRM [id number]. Topic: Clinical - Medication Refill >> Jun 17, 2024 11:39 AM Kendralyn S wrote: Medication: amphetamine -dextroamphetamine  (ADDERALL) 30 MG tablet   Has the patient contacted their pharmacy? Yes (Agent: If no, request that the patient contact the pharmacy for the refill. If patient does not wish to contact the pharmacy document the reason why and proceed with request.) (Agent: If yes, when and what did the pharmacy advise?)  This is the patient's preferred pharmacy:  [facility] #[id number] - [place], [place] - [address] AT [facility] OF [address] [place] [address] Phone: [phone] Fax: [phone] Hours: Not open 24 hours    Is this the correct pharmacy for this prescription? Yes If no, delete pharmacy and type the correct one.   Has the prescription been filled recently? No  Is the patient out of the medication? Yes  Has the patient been seen for an appointment in the last year OR does the patient have an upcoming appointment? Yes  Can we respond through MyChart? no  Agent: Please be advised that Rx refills may take up to 3 business days. We ask that you follow-up with your pharmacy. >> Jun 18, 2024  9:37 AM Amy B wrote: Wife called again stating that the patient still has not received a refill of Adderall.  She states they have been waiting 4 days with no response.   >> Jun 17, 2024  3:36 PM Corin V wrote: Patient wife called to follow up on this refill request. She stated he is out of the medication and is having headaches and is having extreme irritability without it.

## 2024-06-18 NOTE — Addendum Note (Signed)
 Addended by: Alyas Creary P on: 06/18/2024 10:14 AM   Modules accepted: Orders

## 2024-06-19 ENCOUNTER — Other Ambulatory Visit: Payer: Self-pay | Admitting: Family Medicine

## 2024-06-19 ENCOUNTER — Telehealth: Payer: Self-pay

## 2024-06-19 MED ORDER — AMPHETAMINE-DEXTROAMPHETAMINE 30 MG PO TABS: 30.0000 mg | ORAL_TABLET | Freq: Three times a day (TID) | ORAL | 0 refills | Status: DC

## 2024-06-19 NOTE — Telephone Encounter (Signed)
 Copied from CRM [id number]. Topic: Clinical - Prescription Issue >> Jun 19, 2024 12:40 PM Cherylann RAMAN wrote: Reason for CRM: Patient was on hold for an extended period of time and the call got disconnected. Reached out to [facility] but they are on lunch. Informed patient. He is requesting an update on his prescription refill of amphetamine -dextroamphetamine  (ADDERALL) 30 MG tablet. Please advise. Patient can be contacted at [phone].

## 2024-06-19 NOTE — Telephone Encounter (Signed)
 Patient wife Annabella informed that Dr. Alvia can not prescribe out of state but that he did send the refill to  [name] locally.

## 2024-06-19 NOTE — Telephone Encounter (Signed)
 Copied from CRM [id number]. Topic: Clinical - Medication Question >> Jun 19, 2024  2:05 PM Anairis L wrote: Reason for CRM: Patient following up on amphetamine -dextroamphetamine  (ADDERALL) 30 MG tablet  refill

## 2024-06-19 NOTE — Telephone Encounter (Signed)
 Patient wife William Willis informed that Dr. Alvia can not prescribe out of state but that he did send the refill to  [name] locally.

## 2024-06-19 NOTE — Telephone Encounter (Signed)
 Patient wife William Willis informed that Dr. Alvia can not prescribe out of state but that he did send the refill to  William Willis locally.

## 2024-06-19 NOTE — Telephone Encounter (Signed)
 Patient wife Annabella informed that Dr. Alvia can not write for controlled medications out of state. He has sent the refill to [name] locally

## 2024-06-19 NOTE — Telephone Encounter (Signed)
 Copied from CRM [id number]. Topic: General - Other >> Jun 19, 2024 11:06 AM Victoria B wrote: Reason for CRM: patient's mother called in about status of refill of adderall. I let her know this is still being worked on. >> Jun 19, 2024 12:38 PM Mercer PEDLAR wrote: Patient is calling for refill status. He stated that he is out of his meds and wondering why it is taking so long for refill to be completed. I attempted to contact [facility] but it patient's call is during lunch time. Please contact patient with an update or fif there are any issues with the refill.

## 2024-07-08 ENCOUNTER — Other Ambulatory Visit: Payer: Self-pay | Admitting: Family Medicine

## 2024-07-08 DIAGNOSIS — M5416 Radiculopathy, lumbar region: Secondary | ICD-10-CM

## 2024-07-22 ENCOUNTER — Telehealth: Payer: Self-pay | Admitting: Family Medicine

## 2024-07-22 NOTE — Telephone Encounter (Unsigned)
 Copied from CRM #[id number]. Topic: Clinical - Medication Refill >> Jul 22, 2024  8:52 AM Fonda T wrote: Medication: amphetamine -dextroamphetamine  (ADDERALL) 30 MG tablet  Pt mother, Luke, is requesting med refill on behalf of pt, states he will be leaving to out of state and would like refilled prior to leaving on Tuesday, 07/23/24, will be out of town a  couple of months  Has the patient contacted their pharmacy? Yes, advised to contact   This is the patient's preferred pharmacy:  [facility] [id number] - [place], [place] - [address] [place] [address] Phone: [phone] Fax: [phone]    Is this the correct pharmacy for this prescription? Yes If no, delete pharmacy and type the correct one.   Has the prescription been filled recently? Yes  Is the patient out of the medication? Yes  Has the patient been seen for an appointment in the last year OR does the patient have an upcoming appointment? Yes, last seen 02/13/24  Can we respond through MyChart? Yes  Agent: Please be advised that Rx refills may take up to 3 business days. We ask that you follow-up with your pharmacy.

## 2024-07-23 ENCOUNTER — Telehealth: Payer: Self-pay

## 2024-07-23 MED ORDER — AMPHETAMINE-DEXTROAMPHETAMINE 30 MG PO TABS: 30.0000 mg | ORAL_TABLET | Freq: Three times a day (TID) | ORAL | 0 refills | Status: DC

## 2024-07-23 NOTE — Telephone Encounter (Signed)
 Copied from CRM #[id number]. Topic: Clinical - Medication Refill >> Jul 22, 2024  8:52 AM Fonda T wrote: Medication: amphetamine -dextroamphetamine  (ADDERALL) 30 MG tablet  Pt mother, Luke, is requesting med refill on behalf of pt, states he will be leaving to out of state and would like refilled prior to leaving on Tuesday, 07/23/24, will be out of town a  couple of months  Has the patient contacted their pharmacy? Yes, advised to contact   This is the patient's preferred pharmacy:  [facility] [id number] - [place], [place] - [address] [place] [address] Phone: [phone] Fax: [phone]    Is this the correct pharmacy for this prescription? Yes If no, delete pharmacy and type the correct one.   Has the prescription been filled recently? Yes  Is the patient out of the medication? Yes  Has the patient been seen for an appointment in the last year OR does the patient have an upcoming appointment? Yes, last seen 02/13/24  Can we respond through MyChart? Yes  Agent: Please be advised that Rx refills may take up to 3 business days. We ask that you follow-up with your pharmacy. >> Jul 23, 2024 12:35 PM Amy B wrote: Patient states prescription for  amphetamine -dextroamphetamine  (ADDERALL) 30 MG tablet needs to be sent to the [facility] in [place].  He is going out of state for a month and needs the medication asap.  Pharmacy location preference has been updated.

## 2024-07-23 NOTE — Telephone Encounter (Signed)
 Requesting rx rf of Adderall 30mg   Last written 06/19/2024 Last OV 02/13/2024 Upcoming appt = none

## 2024-07-25 NOTE — Telephone Encounter (Signed)
 Copied from CRM #[id number]. Topic: Clinical - Medication Refill >> Jul 22, 2024  8:52 AM Fonda T wrote: Medication: amphetamine -dextroamphetamine  (ADDERALL) 30 MG tablet  Pt mother, William Willis, is requesting med refill on behalf of pt, states he will be leaving to out of state and would like refilled prior to leaving on Tuesday, 07/23/24, will be out of town a couple of months  Has the patient contacted their pharmacy? Yes, advised to contact   This is the patient's preferred pharmacy:  [facility] [id number] - [place], [place] - [address] [place] [address] Phone: [phone] Fax: [phone]    Is this the correct pharmacy for this prescription? Yes If no, delete pharmacy and type the correct one.   Has the prescription been filled recently? Yes  Is the patient out of the medication? Yes  Has the patient been seen for an appointment in the last year OR does the patient have an upcoming appointment? Yes, last seen 02/13/24  Can we respond through MyChart? Yes  Agent: Please be advised that Rx refills may take up to 3 business days. We ask that you follow-up with your pharmacy. >> Jul 24, 2024  1:08 PM Larissa S wrote: Patient's mother, William Willis, calling to check status of refill. Advised prescription sent to pharmacy.  >> Jul 23, 2024 12:35 PM Amy B wrote: Patient states prescription for  amphetamine -dextroamphetamine  (ADDERALL) 30 MG tablet needs to be sent to the [facility] in [place].  He is going out of state for a month and needs the medication asap.  Pharmacy location preference has been updated.

## 2024-07-25 NOTE — Telephone Encounter (Signed)
 This task has been completed as requested. Please review other TE for additional information. No further action needed.

## 2024-08-03 ENCOUNTER — Other Ambulatory Visit: Payer: Self-pay | Admitting: Family Medicine

## 2024-08-19 ENCOUNTER — Other Ambulatory Visit: Payer: Self-pay | Admitting: Family Medicine

## 2024-08-19 DIAGNOSIS — F411 Generalized anxiety disorder: Secondary | ICD-10-CM

## 2024-08-19 DIAGNOSIS — M5416 Radiculopathy, lumbar region: Secondary | ICD-10-CM

## 2024-08-19 NOTE — Telephone Encounter (Signed)
 Copied from CRM [id number]. Topic: Clinical - Medication Refill >> Aug 19, 2024 10:59 AM Rosaria A wrote: Medication: amphetamine -dextroamphetamine  (ADDERALL) 30 MG tablet  busPIRone  (BUSPAR ) 30 MG tablet citalopram  (CELEXA ) 20 MG tablet gabapentin  (NEURONTIN ) 800 MG tablet  pantoprazole  (PROTONIX ) 40 MG tablet  pregabalin  (LYRICA ) 100 MG capsule tadalafil  (CIALIS ) 20 MG tablet  Has the patient contacted their pharmacy? No   This is the patient's preferred pharmacy:  [facility] #[id number] - [place], [place] - [address] [place] [address] Phone: [phone] Fax: [phone]    Is this the correct pharmacy for this prescription? Yes   Has the prescription been filled recently? Yes tadalafil  (CIALIS ) 20 MG tablet amphetamine -dextroamphetamine  (ADDERALL) 30 MG tablet Both medications have been filled recently.   Is the patient out of the medication? No  Has the patient been seen for an appointment in the last year OR does the patient have an upcoming appointment? Yes  Can we respond through MyChart? No  Agent: Please be advised that Rx refills may take up to 3 business days. We ask that you follow-up with your pharmacy.

## 2024-08-19 NOTE — Telephone Encounter (Signed)
 Medications already at [facility].

## 2024-08-19 NOTE — Telephone Encounter (Signed)
 I spoke with patient's wife and all he needs is the Adderall. I advised he is past due for a follow up appointment. Patient has been scheduled.

## 2024-08-19 NOTE — Telephone Encounter (Signed)
 Copied from CRM [id number]. Topic: Clinical - Medication Refill >> Aug 19, 2024 10:59 AM Rosaria A wrote: Medication: amphetamine -dextroamphetamine  (ADDERALL) 30 MG tablet  busPIRone  (BUSPAR ) 30 MG tablet citalopram  (CELEXA ) 20 MG tablet gabapentin  (NEURONTIN ) 800 MG tablet  pantoprazole  (PROTONIX ) 40 MG tablet  pregabalin  (LYRICA ) 100 MG capsule tadalafil  (CIALIS ) 20 MG tablet  Has the patient contacted their pharmacy? No   This is the patient's preferred pharmacy:  [facility] #[id number] - [place], [place] - [address] [place] [address] Phone: [phone] Fax: [phone]    Is this the correct pharmacy for this prescription? Yes   Has the prescription been filled recently? Yes tadalafil  (CIALIS ) 20 MG tablet amphetamine -dextroamphetamine  (ADDERALL) 30 MG tablet Both medications have been filled recently.   Is the patient out of the medication? No  Has the patient been seen for an appointment in the last year OR does the patient have an upcoming appointment? Yes  Can we respond through MyChart? No  Agent: Please be advised that Rx refills may take up to 3 business days. We ask that you follow-up with your pharmacy. >> Aug 19, 2024 11:50 AM Delon DASEN wrote: Called back to changed pharmacy to [facility] on [address]

## 2024-08-21 ENCOUNTER — Ambulatory Visit (INDEPENDENT_AMBULATORY_CARE_PROVIDER_SITE_OTHER): Payer: Self-pay | Admitting: Family Medicine

## 2024-08-21 ENCOUNTER — Encounter: Payer: Self-pay | Admitting: Family Medicine

## 2024-08-21 VITALS — BP 115/68 | HR 75 | Ht 67.0 in | Wt 169.0 lb

## 2024-08-21 DIAGNOSIS — G8929 Other chronic pain: Secondary | ICD-10-CM | POA: Insufficient documentation

## 2024-08-21 DIAGNOSIS — M545 Low back pain, unspecified: Secondary | ICD-10-CM

## 2024-08-21 DIAGNOSIS — F411 Generalized anxiety disorder: Secondary | ICD-10-CM

## 2024-08-21 DIAGNOSIS — F902 Attention-deficit hyperactivity disorder, combined type: Secondary | ICD-10-CM

## 2024-08-21 MED ORDER — CELECOXIB 200 MG PO CAPS: 200.0000 mg | ORAL_CAPSULE | Freq: Two times a day (BID) | ORAL | 2 refills | Status: AC

## 2024-08-21 MED ORDER — AMPHETAMINE-DEXTROAMPHETAMINE 30 MG PO TABS: 30.0000 mg | ORAL_TABLET | Freq: Three times a day (TID) | ORAL | 0 refills | Status: AC

## 2024-08-21 NOTE — Assessment & Plan Note (Signed)
 Discussed limiting tylenol  and ibuprofen .  Continue gabapentin  at current strength.  Trial of celebrex  to replace ibuprofen .

## 2024-08-21 NOTE — Patient Instructions (Signed)
 Try celebrex  in place of ibuprofen 

## 2024-08-21 NOTE — Progress Notes (Signed)
 " William Willis - 43 y.o. male MRN [id number]  Date of birth: 1981/08/06  Subjective Chief Complaint  Patient presents with   Medical Management of Chronic Issues    ADHD    HPI William Willis is a 43 y.o. male here today for follow up visit.   Reports that he is doing ok.  Continues on adderall 30mg  TID.  Reports that this continues to work well for him.  No side effects at current strength.  Would like to continue at current strength.    Remains on celexa  and buspar  for management of anxiety.  This is stable with current medications.   Chronic lumbar radiculitis is stable.  Remains on gabapentin .  This does help but he remains in fairly constant pain.  MRI in January with only mild changes.  He is using tylenol  and ibuprofen  in addition to this but this is not helping very much.    ROS:  A comprehensive ROS was completed and negative except as noted per HPI  Allergies[1]  Past Medical History:  Diagnosis Date   ADD (attention deficit disorder)    Anxiety    Aortic insufficiency    Pneumonia    Tobacco abuse     Past Surgical History:  Procedure Laterality Date   ADENOIDECTOMY     SHOULDER SURGERY Right 12-08-11   TONSILLECTOMY     WISDOM TOOTH EXTRACTION  10/19/2017    Social History   Socioeconomic History   Marital status: Legally Separated    Spouse name: Not on file   Number of children: 2   Years of education: 12   Highest education level: High school graduate  Occupational History   Occupation: Works in [employer]ologist, [employer]  Tobacco Use   Smoking status: Every Day    Current packs/day: 2.00    Average packs/day: 2.0 packs/day for 33.0 years (66.0 ttl pk-yrs)    Types: Cigarettes    Start date: 1993   Smokeless tobacco: Never   Tobacco comments:    currently up to 2-4 packs, average was 2 for many years  Vaping Use   Vaping status: Some Days  Substance and Sexual Activity   Alcohol use: Yes    Comment: rare   Drug use: Yes    Types: Marijuana    Sexual activity: Yes    Partners: Female  Other Topics Concern   Not on file  Social History Narrative   Not on file   Social Drivers of Health   Tobacco Use: High Risk (02/13/2024)   Patient History    Smoking Tobacco Use: Every Day    Smokeless Tobacco Use: Never    Passive Exposure: Not on file  Financial Resource Strain: Not on file  Food Insecurity: Not on file  Transportation Needs: Not on file  Physical Activity: Not on file  Stress: Not on file  Social Connections: Unknown (12/30/2021)   Received from [facility]   Social Network    Social Network: Not on file  Depression (PHQ2-9): Low Risk (02/13/2024)   Depression (PHQ2-9)    PHQ-2 Score: 0  Alcohol Screen: Not on file  Housing: Not on file  Utilities: Not on file  Health Literacy: Not on file    Family History  Problem Relation Age of Onset   Dementia Maternal Grandmother    Hypertension Maternal Grandfather    Diabetes Maternal Grandfather    Heart attack Maternal Grandfather        x11   Stroke Maternal Grandfather  x2   Prostate cancer Maternal Grandfather    Esophageal cancer Maternal Grandfather     Health Maintenance  Topic Date Due   COVID-19 Vaccine (1) Never done   HIV Screening  Never done   Hepatitis C Screening  Never done   Pneumococcal Vaccine (1 of 2 - PCV) Never done   Hepatitis B Vaccines 19-59 Average Risk (1 of 3 - 19+ 3-dose series) Never done   HPV VACCINES (1 - 3-dose SCDM series) Never done   Influenza Vaccine  11/19/2024 (Originally 03/22/2024)   DTaP/Tdap/Td (2 - Td or Tdap) 02/07/2025   Meningococcal B Vaccine  Aged Out     ----------------------------------------------------------------------------------------------------------------------------------------------------------------------------------------------------------------- Physical Exam BP 115/68   Pulse 75   Ht 5' 7 (1.702 m)   Wt 169 lb (76.7 kg)   SpO2 96%   BMI 26.47 kg/m   Physical  Exam Constitutional:      Appearance: Normal appearance.  Eyes:     General: No scleral icterus. Cardiovascular:     Rate and Rhythm: Normal rate and regular rhythm.  Pulmonary:     Effort: Pulmonary effort is normal.     Breath sounds: Normal breath sounds.  Musculoskeletal:     Cervical back: Neck supple.  Neurological:     General: No focal deficit present.     Mental Status: He is alert.  Psychiatric:        Mood and Affect: Mood normal.        Behavior: Behavior normal.     ------------------------------------------------------------------------------------------------------------------------------------------------------------------------------------------------------------------- Assessment and Plan  Chronic low back pain Discussed limiting tylenol  and ibuprofen .  Continue gabapentin  at current strength.  Trial of celebrex  to replace ibuprofen .   Anxiety state Continue citalopram  and buspar  at current strength.    ADD (attention deficit disorder) He is doing well with adderall 30mg  tid.  Will plan to continue at current strength.  PDMP reviewed. F/u in 6 months.    Meds ordered this encounter  Medications   amphetamine -dextroamphetamine  (ADDERALL) 30 MG tablet    Sig: Take 1 tablet by mouth 3 (three) times daily.    Dispense:  270 tablet    Refill:  0   celecoxib  (CELEBREX ) 200 MG capsule    Sig: Take 1 capsule (200 mg total) by mouth 2 (two) times daily. One to 2 tablets by mouth daily as needed for pain.    Dispense:  60 capsule    Refill:  2    Return in about 6 months (around 02/18/2025) for ADHD.        [1] No Known Allergies  "

## 2024-08-21 NOTE — Assessment & Plan Note (Signed)
Continue citalopram and buspar at current strength.

## 2024-08-21 NOTE — Assessment & Plan Note (Signed)
 He is doing well with adderall 30mg  tid.  Will plan to continue at current strength.  PDMP reviewed. F/u in 6 months.

## 2024-09-12 ENCOUNTER — Other Ambulatory Visit: Payer: Self-pay | Admitting: Family Medicine

## 2024-09-12 DIAGNOSIS — M5416 Radiculopathy, lumbar region: Secondary | ICD-10-CM

## 2025-02-18 ENCOUNTER — Ambulatory Visit: Admitting: Family Medicine
